# Patient Record
Sex: Female | Born: 1950 | Marital: Single
Health system: Southern US, Community
[De-identification: ages and names within clinical notes are randomized; demographics above are authoritative.]

---

## 2021-06-18 ENCOUNTER — Inpatient Hospital Stay
Admission: RE | Admit: 2021-06-18 | Discharge: 2021-07-29 | Disposition: E | Payer: Medicare PPO | Source: Ambulatory Visit | Attending: Internal Medicine | Admitting: Internal Medicine

## 2021-06-18 ENCOUNTER — Other Ambulatory Visit (HOSPITAL_COMMUNITY): Payer: Medicare PPO

## 2021-06-18 DIAGNOSIS — J449 Chronic obstructive pulmonary disease, unspecified: Secondary | ICD-10-CM | POA: Diagnosis not present

## 2021-06-18 DIAGNOSIS — M7989 Other specified soft tissue disorders: Secondary | ICD-10-CM

## 2021-06-18 DIAGNOSIS — J9621 Acute and chronic respiratory failure with hypoxia: Secondary | ICD-10-CM

## 2021-06-18 DIAGNOSIS — I5032 Chronic diastolic (congestive) heart failure: Secondary | ICD-10-CM

## 2021-06-18 DIAGNOSIS — J189 Pneumonia, unspecified organism: Secondary | ICD-10-CM | POA: Diagnosis not present

## 2021-06-18 DIAGNOSIS — I480 Paroxysmal atrial fibrillation: Secondary | ICD-10-CM

## 2021-06-18 LAB — COMPREHENSIVE METABOLIC PANEL
ALT: 17 U/L (ref 0–44)
AST: 16 U/L (ref 15–41)
Albumin: 1.7 g/dL — ABNORMAL LOW (ref 3.5–5.0)
Alkaline Phosphatase: 87 U/L (ref 38–126)
Anion gap: 8 (ref 5–15)
BUN: 34 mg/dL — ABNORMAL HIGH (ref 8–23)
CO2: 21 mmol/L — ABNORMAL LOW (ref 22–32)
Calcium: 9 mg/dL (ref 8.9–10.3)
Chloride: 105 mmol/L (ref 98–111)
Creatinine, Ser: 0.65 mg/dL (ref 0.44–1.00)
GFR, Estimated: >60 mL/min (ref >60)
Glucose, Bld: 128 mg/dL — ABNORMAL HIGH (ref 70–99)
Potassium: 3.6 mmol/L (ref 3.5–5.1)
Sodium: 134 mmol/L — ABNORMAL LOW (ref 135–145)
Total Bilirubin: 0.6 mg/dL (ref 0.3–1.2)
Total Protein: 5.5 g/dL — ABNORMAL LOW (ref 6.5–8.1)

## 2021-06-18 LAB — PROTIME-INR
INR: 1.4 — ABNORMAL HIGH (ref 0.8–1.2)
Prothrombin Time: 17.2 seconds — ABNORMAL HIGH (ref 11.4–15.2)

## 2021-06-18 LAB — CBC
HCT: 29 % — ABNORMAL LOW (ref 36.0–46.0)
Hemoglobin: 9.4 g/dL — ABNORMAL LOW (ref 12.0–15.0)
MCH: 31 pg (ref 26.0–34.0)
MCHC: 32.4 g/dL (ref 30.0–36.0)
MCV: 95.7 fL (ref 80.0–100.0)
Platelets: 430 10*3/uL — ABNORMAL HIGH (ref 150–400)
RBC: 3.03 MIL/uL — ABNORMAL LOW (ref 3.87–5.11)
RDW: 15.5 % (ref 11.5–15.5)
WBC: 29.9 10*3/uL — ABNORMAL HIGH (ref 4.0–10.5)
nRBC: 0 % (ref 0.0–0.2)

## 2021-06-18 LAB — BLOOD GAS, ARTERIAL
Acid-base deficit: 5.2 mmol/L — ABNORMAL HIGH (ref 0.0–2.0)
Acid-base deficit: 6.5 mmol/L — ABNORMAL HIGH (ref 0.0–2.0)
Bicarbonate: 23 mmol/L (ref 20.0–28.0)
Bicarbonate: 21.4 mmol/L (ref 20.0–28.0)
O2 Saturation: 98.2 %
O2 Saturation: 98.9 %
Patient temperature: 36.3
Patient temperature: 36.1
pCO2 arterial: 53 mmHg — ABNORMAL HIGH (ref 32–48)
pCO2 arterial: 49 mmHg — ABNORMAL HIGH (ref 32–48)
pH, Arterial: 7.24 — ABNORMAL LOW (ref 7.35–7.45)
pH, Arterial: 7.24 — ABNORMAL LOW (ref 7.35–7.45)
pO2, Arterial: 86 mmHg (ref 83–108)
pO2, Arterial: 86 mmHg (ref 83–108)

## 2021-06-18 LAB — LACTIC ACID, PLASMA: Lactic Acid, Venous: 0.7 mmol/L (ref 0.5–1.9)

## 2021-06-18 LAB — MAGNESIUM: Magnesium: 2.5 mg/dL — ABNORMAL HIGH (ref 1.7–2.4)

## 2021-06-18 MED ORDER — DIATRIZOATE MEGLUMINE & SODIUM 66-10 % PO SOLN
30.0000 mL | Freq: Once | ORAL | Status: AC
  Administered 2021-06-18: 30 mL

## 2021-06-18 NOTE — H&P (Addendum)
Pulmonary Critical Care Medicine ?[place] ?  ?PULMONARY CRITICAL CARE SERVICE ? ?PROGRESS NOTE ? ? ? ? ?Sherry Mcmahon  ?[id number]  ?DOB: March 14, 1950  ? ?DOA: 05/30/2021 ? ?Referring Physician: Satira Sark, MD ? ?HPI: Sherry Mcmahon is a 71 y.o. female being followed for ventilator/airway/oxygen weaning Acute on Chronic Respiratory Failure.  ? ?This is a 71 year old caucasian female with past medical history of COPD, etoh abuse, medication non-compliance, paroxysmal afib, pacemaker, chronic diastolic heart failure, 40 year history of smoking, hypertension, meningitis, osteopenia, spinal stenosis, depression, intracranial injury with surgical intervention. This patient presented initially presented to acute hospital with metabolic encephalology and increased shortness of breath. She was admitted for COPD exacerbation and acute on chronic respiratory failure with hypoxia and hypercapnia. Her hospital course was complicated bu septic shock, AKI, cardiogenic shock, metabolic acidosis, thrombocytopenia, and aspiration pneumonia. She subsequently was intubated for further respiratory distress on March 22nd. Blood cultures, urine culture and sputum were positive for Ecoli. Despite being on Vanco, meropenem, her leukocytosis remained elevated, and was placed on Micafungin. Her AKI further got worse requiring HD to be initiated 03/25. She underwent trach placement 04/10, with PEG placement on 4/11. She was transferred to [facility] for further vent weaning. On arrival her ABG remained consistent with metabiotic acidosis and her white count was significantly elevated. At this time she will remain on full support without weaning, underlying acidosis needs to be managed. I have placed an order for lactic acid and CT of the chest for further evaluation.  ? ?Medications: ?Reviewed on Rounds ? ?Physical Exam: ? ?Vitals: temp 97.4, pulse 80, respirations 26, BP 123/68, Spo2 95% ? ?Ventilator Settings AC VC 35%,  rate 16, TV 400 peep 5 ? ?General: Comfortable at this time ?Neck: supple ?Cardiovascular: no malignant arrhythmias ?Respiratory: Bilaterally diminished  ?Skin: no rash seen on limited exam ?Musculoskeletal: No gross abnormality ?Psychiatric:unable to assess ?Neurologic:no involuntary movements   ?   ?   ?Lab Data:  ? ?Basic Metabolic Panel: ?Recent Labs  ?Lab 06/25/2021 ?0446  ?NA 134*  ?K 3.6  ?CL 105  ?CO2 21*  ?GLUCOSE 128*  ?BUN 34*  ?CREATININE 0.65  ?CALCIUM 9.0  ?MG 2.5*  ? ? ?ABG: ?Recent Labs  ?Lab 05/30/2021 ?0205 06/25/2021 ?0600  ?PHART 7.24* 7.24*  ?PCO2ART 53* 49*  ?PO2ART 86 86  ?HCO3 23.0 21.4  ?O2SAT 98.2 98.9  ? ? ?Liver Function Tests: ?Recent Labs  ?Lab 06/17/2021 ?0446  ?AST 16  ?ALT 17  ?ALKPHOS 87  ?BILITOT 0.6  ?PROT 5.5*  ?ALBUMIN 1.7*  ? ?No results for input(s): LIPASE, AMYLASE in the last 168 hours. ?No results for input(s): AMMONIA in the last 168 hours. ? ?CBC: ?Recent Labs  ?Lab 06/11/2021 ?0446  ?WBC 29.9*  ?HGB 9.4*  ?HCT 29.0*  ?MCV 95.7  ?PLT 430*  ? ? ?Cardiac Enzymes: ?No results for input(s): CKTOTAL, CKMB, CKMBINDEX, TROPONINI in the last 168 hours. ? ?BNP (last 3 results) ?No results for input(s): BNP in the last 8760 hours. ? ?ProBNP (last 3 results) ?No results for input(s): PROBNP in the last 8760 hours. ? ?Radiological Exams: ?DG ABDOMEN PEG TUBE LOCATION ? ?Result Date: 06/05/2021 ?CLINICAL DATA:  71 year old female status post PEG tube placement. EXAM: ABDOMEN - 1 VIEW COMPARISON:  No priors. FINDINGS: Percutaneous gastrostomy tube noted with retention balloon projecting over the stomach. There is a small amount of oral contrast material within the lumen of the proximal stomach. Remaining portions of the visualized  bowel gas pattern are unremarkable, without definite pathologic dilatation of visualized small bowel or colon. No definite pneumoperitoneum on this single supine view. IMPRESSION: 1. Tip of percutaneous gastrostomy tube retention balloon projects over the stomach, with  contrast material noted within the lumen of the stomach. Electronically Signed   By: Vinnie Langton M.D.   On: 06/24/2021 05:09  ? ?DG CHEST PORT 1 VIEW ? ?Result Date: 06/09/2021 ?CLINICAL DATA:  Respiratory failure EXAM: PORTABLE CHEST 1 VIEW COMPARISON:  None. FINDINGS: Tracheostomy tube in place. Partially covered percutaneous gastrostomy tube. Dual-chamber pacer leads from the left.  Cardiomegaly. Extensive bilateral pulmonary opacity with patchy appearance. Volume loss and probable pleural fluid on the right. No visible pneumothorax. Enteric contrast at the level of the proximal stomach. IMPRESSION: Bilateral airspace disease with heterogeneous appearance favoring pneumonia. Probable pleural fluid on the right where there is also volume loss. Electronically Signed   By: Jorje Guild M.D.   On: 06/04/2021 04:48   ? ?Assessment/Plan ?Active Problems: ?  Acute on chronic respiratory failure with hypoxia (HCC) ?  CAP (community acquired pneumonia) ?  COPD mixed type ([place]) ?  Chronic diastolic CHF (congestive heart failure) ([place]) ?  Paroxysmal atrial fibrillation (HCC) ? ?Acute on chronic respiratory failure with hypoxia and hypercapnia- remains on full support today. Continue with pulmonary toilet. ?Persistent community acquired pneumonia-patient has completed outside course of broad-spectrum antibiotics, remains with elevated leukocytosis despite management.  Was started on micafungin.  Awaiting CT imaging for clear picture.  Continue monitoring CBC and vital signs. ?COPD-Continue with supportive care and make medical adjustments as necessary. ?Chronic diastolic heart failure- continue to follow fluid status, continue telemetry. ? ? ?I have personally seen and evaluated the patient, evaluated laboratory and imaging results, formulated the assessment and plan and placed orders. ?The Patient requires high complexity decision making with multiple systems involvement.  ?Rounds were done with the Respiratory  Therapy Director and Staff therapists and discussed with nursing staff also. ? ?Allyne Gee, MD FCCP ?Pulmonary Critical Care Medicine ?Sleep Medicine  ?

## 2021-06-19 DIAGNOSIS — J189 Pneumonia, unspecified organism: Secondary | ICD-10-CM | POA: Diagnosis not present

## 2021-06-19 DIAGNOSIS — J449 Chronic obstructive pulmonary disease, unspecified: Secondary | ICD-10-CM | POA: Diagnosis not present

## 2021-06-19 DIAGNOSIS — I5032 Chronic diastolic (congestive) heart failure: Secondary | ICD-10-CM | POA: Diagnosis not present

## 2021-06-19 DIAGNOSIS — J9621 Acute and chronic respiratory failure with hypoxia: Secondary | ICD-10-CM | POA: Diagnosis not present

## 2021-06-19 LAB — BLOOD GAS, ARTERIAL
Acid-base deficit: 3.3 mmol/L — ABNORMAL HIGH (ref 0.0–2.0)
Bicarbonate: 23.2 mmol/L (ref 20.0–28.0)
Drawn by: 164
O2 Saturation: 99 %
Patient temperature: 37
pCO2 arterial: 46 mmHg (ref 32–48)
pH, Arterial: 7.31 — ABNORMAL LOW (ref 7.35–7.45)
pO2, Arterial: 85 mmHg (ref 83–108)

## 2021-06-19 NOTE — Progress Notes (Addendum)
Pulmonary Critical Care Medicine ?[facility] [place] ?  ?PULMONARY CRITICAL CARE SERVICE ? ?PROGRESS NOTE ? ? ? ? ?Sherry Mcmahon  ?[id number]  ?DOB: [birth year]  ? ?DOA: 06/10/2021 ? ?Referring Physician: Luna Kitchens, MD ? ?HPI: Sherry Mcmahon is a 71 y.o. female being followed for ventilator/airway/oxygen weaning Acute on Chronic Respiratory Failure.  Patient seen lying in bed, currently remains on full support.  She refused to have her CT completed this morning.  I spoke to internal medicine about approaching it with some as needed medication and attempting a second time.  No acute distress, no acute overnight events. ? ?Medications: ?Reviewed on Rounds ? ?Physical Exam: ? ?Vitals: Temp 96.3, pulse 80, respirations 27, BP 125/70, SPO2 97% ? ?Ventilator Settings AC VC, tidal volume 480, FiO2 28%, rate 16, PEEP 5 ? ?General: Comfortable at this time ?Neck: supple ?Cardiovascular: no malignant arrhythmias ?Respiratory: Bilaterally clear ?Skin: no rash seen on limited exam ?Musculoskeletal: No gross abnormality ?Psychiatric:unable to assess ?Neurologic:no involuntary movements   ?   ?   ?Lab Data:  ? ?Basic Metabolic Panel: ?Recent Labs  ?Lab 06/09/2021 ?0446  ?NA 134*  ?K 3.6  ?CL 105  ?CO2 21*  ?GLUCOSE 128*  ?BUN 34*  ?CREATININE 0.65  ?CALCIUM 9.0  ?MG 2.5*  ? ? ?ABG: ?Recent Labs  ?Lab 06/26/2021 ?0205 06/22/2021 ?0600  ?PHART 7.24* 7.24*  ?PCO2ART 53* 49*  ?PO2ART 86 86  ?HCO3 23.0 21.4  ?O2SAT 98.2 98.9  ? ? ?Liver Function Tests: ?Recent Labs  ?Lab 06/08/2021 ?0446  ?AST 16  ?ALT 17  ?ALKPHOS 87  ?BILITOT 0.6  ?PROT 5.5*  ?ALBUMIN 1.7*  ? ?No results for input(s): LIPASE, AMYLASE in the last 168 hours. ?No results for input(s): AMMONIA in the last 168 hours. ? ?CBC: ?Recent Labs  ?Lab 06/09/2021 ?0446  ?WBC 29.9*  ?HGB 9.4*  ?HCT 29.0*  ?MCV 95.7  ?PLT 430*  ? ? ?Cardiac Enzymes: ?No results for input(s): CKTOTAL, CKMB, CKMBINDEX, TROPONINI in the last 168 hours. ? ?BNP (last 3 results) ?No results for  input(s): BNP in the last 8760 hours. ? ?ProBNP (last 3 results) ?No results for input(s): PROBNP in the last 8760 hours. ? ?Radiological Exams: ?DG ABDOMEN PEG TUBE LOCATION ? ?Result Date: 06/15/2021 ?CLINICAL DATA:  71 year old female status post PEG tube placement. EXAM: ABDOMEN - 1 VIEW COMPARISON:  No priors. FINDINGS: Percutaneous gastrostomy tube noted with retention balloon projecting over the stomach. There is a small amount of oral contrast material within the lumen of the proximal stomach. Remaining portions of the visualized bowel gas pattern are unremarkable, without definite pathologic dilatation of visualized small bowel or colon. No definite pneumoperitoneum on this single supine view. IMPRESSION: 1. Tip of percutaneous gastrostomy tube retention balloon projects over the stomach, with contrast material noted within the lumen of the stomach. Electronically Signed   By: Trudie Reed M.D.   On: 05/31/2021 05:09  ? ?DG CHEST PORT 1 VIEW ? ?Result Date: 06/14/2021 ?CLINICAL DATA:  Respiratory failure EXAM: PORTABLE CHEST 1 VIEW COMPARISON:  None. FINDINGS: Tracheostomy tube in place. Partially covered percutaneous gastrostomy tube. Dual-chamber pacer leads from the left.  Cardiomegaly. Extensive bilateral pulmonary opacity with patchy appearance. Volume loss and probable pleural fluid on the right. No visible pneumothorax. Enteric contrast at the level of the proximal stomach. IMPRESSION: Bilateral airspace disease with heterogeneous appearance favoring pneumonia. Probable pleural fluid on the right where there is also volume loss. Electronically Signed   By: Christiane Ha  Watts M.D.   On: 07/11/21 04:48   ? ?Assessment/Plan ?Active Problems: ?  * No active hospital problems. * ? ?Acute on chronic respiratory failure with hypoxia and hypercapnia- remain son full support today. Continue with pulmonary toilet. ?Persistent community acquired pneumonia-patient has completed outside course of broad-spectrum  antibiotics, remains with elevated leukocytosis despite management.  Was started on micafungin.  Awaiting CT imaging for clear picture.  Continue monitoring CBC and vital signs. ?COPD-Continue with supportive care and make medical adjustments as necessary. ?Chronic diastolic heart failure- continue to follow fluid status, continue telemetry. ? ? ?I have personally seen and evaluated the patient, evaluated laboratory and imaging results, formulated the assessment and plan and placed orders. ?The Patient requires high complexity decision making with multiple systems involvement.  ?Rounds were done with the Respiratory Therapy Director and Staff therapists and discussed with nursing staff also. ? ?Yevonne Pax, MD FCCP ?Pulmonary Critical Care Medicine ?Sleep Medicine  ?

## 2021-06-20 ENCOUNTER — Other Ambulatory Visit (HOSPITAL_COMMUNITY): Payer: Medicare PPO

## 2021-06-20 DIAGNOSIS — I5032 Chronic diastolic (congestive) heart failure: Secondary | ICD-10-CM | POA: Diagnosis not present

## 2021-06-20 DIAGNOSIS — J449 Chronic obstructive pulmonary disease, unspecified: Secondary | ICD-10-CM | POA: Diagnosis not present

## 2021-06-20 DIAGNOSIS — J189 Pneumonia, unspecified organism: Secondary | ICD-10-CM | POA: Diagnosis not present

## 2021-06-20 DIAGNOSIS — J9621 Acute and chronic respiratory failure with hypoxia: Secondary | ICD-10-CM | POA: Diagnosis not present

## 2021-06-20 NOTE — Progress Notes (Addendum)
Pulmonary Critical Care Medicine ?[facility] [place] ?  ?PULMONARY CRITICAL CARE SERVICE ? ?PROGRESS NOTE ? ? ? ? ?[name]  ?[id number]:[id number]  ?DOB: [birth year]  ? ?DOA: 06/10/2021 ? ?Referring Physician: Luna Kitchens, MD ? ?HPI: Sherry Mcmahon is a 71 y.o. female being followed for ventilator/airway/oxygen weaning Acute on Chronic Respiratory Failure.  Patient seen lying in bed.  Remains on full support.  pH was not within parameters to start weaning yet, likely weaning will start this week.  No acute distress, no acute overnight events. ? ?Medications: ?Reviewed on Rounds ? ?Physical Exam: ? ?Vitals: Temp 97.4, pulse 80, respirations 27, BP 126/77, SPO2 96% ? ?Ventilator Settings AC VC, tidal volume 480, FiO2 28%, rate 16, PEEP 5 ? ?General: Comfortable at this time ?Neck: supple ?Cardiovascular: no malignant arrhythmias ?Respiratory: Bilaterally clear ?Skin: no rash seen on limited exam ?Musculoskeletal: No gross abnormality ?Psychiatric:unable to assess ?Neurologic:no involuntary movements   ?   ?   ?Lab Data:  ? ?Basic Metabolic Panel: ?Recent Labs  ?Lab 06/27/2021 ?0446  ?NA 134*  ?K 3.6  ?CL 105  ?CO2 21*  ?GLUCOSE 128*  ?BUN 34*  ?CREATININE 0.65  ?CALCIUM 9.0  ?MG 2.5*  ? ? ?ABG: ?Recent Labs  ?Lab 05/31/2021 ?0205 06/08/2021 ?0600 06/19/21 ?1405  ?PHART 7.24* 7.24* 7.31*  ?PCO2ART 53* 49* 46  ?PO2ART 86 86 85  ?HCO3 23.0 21.4 23.2  ?O2SAT 98.2 98.9 99  ? ? ?Liver Function Tests: ?Recent Labs  ?Lab 06/25/2021 ?0446  ?AST 16  ?ALT 17  ?ALKPHOS 87  ?BILITOT 0.6  ?PROT 5.5*  ?ALBUMIN 1.7*  ? ?No results for input(s): LIPASE, AMYLASE in the last 168 hours. ?No results for input(s): AMMONIA in the last 168 hours. ? ?CBC: ?Recent Labs  ?Lab 05/31/2021 ?0446  ?WBC 29.9*  ?HGB 9.4*  ?HCT 29.0*  ?MCV 95.7  ?PLT 430*  ? ? ?Cardiac Enzymes: ?No results for input(s): CKTOTAL, CKMB, CKMBINDEX, TROPONINI in the last 168 hours. ? ?BNP (last 3 results) ?No results for input(s): BNP in the last 8760 hours. ? ?ProBNP (last 3  results) ?No results for input(s): PROBNP in the last 8760 hours. ? ?Radiological Exams: ?No results found. ? ?Assessment/Plan ?Active Problems: ?  * No active hospital problems. * ? ?Acute on chronic respiratory failure with hypoxia and hypercapnia- remains on full support today.  pH not within parameters to start weaning just yet.  Likely weaning trials will begin this week.  Continue with pulmonary toilet. ?Persistent community acquired pneumonia-patient has completed outside course of broad-spectrum antibiotics, remains with elevated leukocytosis despite management.  Was started on micafungin.  Awaiting CT imaging for clear picture.  Continue monitoring CBC and vital signs. ?COPD-Continue with supportive care and make medical adjustments as necessary. ?Chronic diastolic heart failure- continue to follow fluid status, continue telemetry. ? ? ?I have personally seen and evaluated the patient, evaluated laboratory and imaging results, formulated the assessment and plan and placed orders. ?The Patient requires high complexity decision making with multiple systems involvement.  ?Rounds were done with the Respiratory Therapy Director and Staff therapists and discussed with nursing staff also. ? ?Yevonne Pax, MD FCCP ?Pulmonary Critical Care Medicine ?Sleep Medicine  ?

## 2021-06-21 DIAGNOSIS — J9621 Acute and chronic respiratory failure with hypoxia: Secondary | ICD-10-CM | POA: Diagnosis not present

## 2021-06-21 DIAGNOSIS — J189 Pneumonia, unspecified organism: Secondary | ICD-10-CM

## 2021-06-21 DIAGNOSIS — J449 Chronic obstructive pulmonary disease, unspecified: Secondary | ICD-10-CM

## 2021-06-21 DIAGNOSIS — I480 Paroxysmal atrial fibrillation: Secondary | ICD-10-CM

## 2021-06-21 DIAGNOSIS — I5032 Chronic diastolic (congestive) heart failure: Secondary | ICD-10-CM | POA: Diagnosis not present

## 2021-06-21 LAB — CBC
HCT: 30.2 % — ABNORMAL LOW (ref 36.0–46.0)
Hemoglobin: 9.2 g/dL — ABNORMAL LOW (ref 12.0–15.0)
MCH: 29.8 pg (ref 26.0–34.0)
MCHC: 30.5 g/dL (ref 30.0–36.0)
MCV: 97.7 fL (ref 80.0–100.0)
Platelets: 381 10*3/uL (ref 150–400)
RBC: 3.09 MIL/uL — ABNORMAL LOW (ref 3.87–5.11)
RDW: 15.8 % — ABNORMAL HIGH (ref 11.5–15.5)
WBC: 18.3 10*3/uL — ABNORMAL HIGH (ref 4.0–10.5)
nRBC: 0.1 % (ref 0.0–0.2)

## 2021-06-21 LAB — COMPREHENSIVE METABOLIC PANEL
ALT: 16 U/L (ref 0–44)
AST: 15 U/L (ref 15–41)
Albumin: 1.5 g/dL — ABNORMAL LOW (ref 3.5–5.0)
Alkaline Phosphatase: 78 U/L (ref 38–126)
Anion gap: 8 (ref 5–15)
BUN: 49 mg/dL — ABNORMAL HIGH (ref 8–23)
CO2: 21 mmol/L — ABNORMAL LOW (ref 22–32)
Calcium: 9.4 mg/dL (ref 8.9–10.3)
Chloride: 109 mmol/L (ref 98–111)
Creatinine, Ser: 0.61 mg/dL (ref 0.44–1.00)
GFR, Estimated: >60 mL/min (ref >60)
Glucose, Bld: 114 mg/dL — ABNORMAL HIGH (ref 70–99)
Potassium: 4.3 mmol/L (ref 3.5–5.1)
Sodium: 138 mmol/L (ref 135–145)
Total Bilirubin: 0.3 mg/dL (ref 0.3–1.2)
Total Protein: 5.6 g/dL — ABNORMAL LOW (ref 6.5–8.1)

## 2021-06-22 ENCOUNTER — Other Ambulatory Visit (HOSPITAL_COMMUNITY): Payer: Medicare PPO

## 2021-06-22 DIAGNOSIS — J189 Pneumonia, unspecified organism: Secondary | ICD-10-CM | POA: Diagnosis not present

## 2021-06-22 DIAGNOSIS — I5032 Chronic diastolic (congestive) heart failure: Secondary | ICD-10-CM | POA: Diagnosis not present

## 2021-06-22 DIAGNOSIS — J9621 Acute and chronic respiratory failure with hypoxia: Secondary | ICD-10-CM | POA: Diagnosis not present

## 2021-06-22 DIAGNOSIS — J449 Chronic obstructive pulmonary disease, unspecified: Secondary | ICD-10-CM | POA: Diagnosis not present

## 2021-06-22 HISTORY — PX: IR THORACENTESIS ASP PLEURAL SPACE W/IMG GUIDE: IMG5380

## 2021-06-22 MED ORDER — LIDOCAINE HCL 1 % IJ SOLN
INTRAMUSCULAR | Status: AC
Start: 2021-06-22 — End: 2021-06-22
  Administered 2021-06-22: 10 mL
  Filled 2021-06-22: qty 20

## 2021-06-22 MED ORDER — LIDOCAINE HCL 1 % IJ SOLN
INTRAMUSCULAR | Status: AC
  Filled 2021-06-22: qty 20

## 2021-06-22 MED ORDER — LIDOCAINE HCL 1 % IJ SOLN
INTRAMUSCULAR | Status: DC | PRN
  Administered 2021-06-22: 5 mL

## 2021-06-22 NOTE — Procedures (Signed)
Vascular and Interventional Radiology Procedure Note ? ?Patient: Sherry Mcmahon ?DOB: Nov 21, 1950 ?Medical Record Number: [id number] ?Note Date/Time: 06/22/21 5:22 PM  ? ?Performing Physician: Roanna Banning, MD ?Assistant(s): None ? ?Diagnosis: R PTX s/p aborted thoracentesis ? ?Procedure: RIGHT PLEURAL DRAINAGE CATHETER PLACEMENT ? ?Anesthesia: Local Anesthetic ?Complications: None ?Estimated Blood Loss: Minimal ?Specimens: Sent for None ? ?Findings:  ?Successful Fluoroscopy-guided placement of 10 F pleural thoracostomy catheter into the RIGHT chest. ? ?Plan:  ?- Maintain to -20 mmHg suction. ?- CXR in AM ?- VIR will continue to follow ? ?See detailed procedure note with images in PACS. ?The patient tolerated the procedure well without incident or complication and was returned to Floor Bed in stable condition.  ? ? ?Roanna Banning, MD ?[facility] ?[facility] ? ? ?Pager. [phone] ?Clinic. [phone]  ?

## 2021-06-22 NOTE — Procedures (Addendum)
Unsuccessful ultrasound-guided therapeutic paracentesis. Unable to access fluid due to patient positioning and patient's inability to remain in an accessible position.  ?

## 2021-06-22 NOTE — Progress Notes (Addendum)
Pulmonary Critical Care Medicine ?[place] ?  ?PULMONARY CRITICAL CARE SERVICE ? ?PROGRESS NOTE ? ? ? ? ?Sherry Mcmahon  ?[id number]  ?DOB: [birth year]  ? ?DOA: 06/05/2021 ? ?Referring Physician: Satira Sark, MD ? ?HPI: Sherry Mcmahon is a 71 y.o. female being followed for ventilator/airway/oxygen weaning Acute on Chronic Respiratory Failure.  Patient seen lying in bed, currently remains on full support.  We are waiting her thoracentesis to be completed before moving onto weaning.  No acute distress, no acute overnight events. ?Medications: ?Reviewed on Rounds ? ?Physical Exam: ? ?Vitals: Temp T7.8, pulse 80, respirations 26, BP 93/48, SPO2 97% ? ?Ventilator Settings AC VC, tidal volume 480, FiO2 28%, rate 16, PEEP 5 ? ?General: Comfortable at this time ?Neck: supple ?Cardiovascular: no malignant arrhythmias ?Respiratory: Bilaterally clear ?Skin: no rash seen on limited exam ?Musculoskeletal: No gross abnormality ?Psychiatric:unable to assess ?Neurologic:no involuntary movements   ?   ?   ?Lab Data:  ? ?Basic Metabolic Panel: ?Recent Labs  ?Lab 06/13/2021 ?0446 06/21/21 ?0912  ?NA 134* 138  ?K 3.6 4.3  ?CL 105 109  ?CO2 21* 21*  ?GLUCOSE 128* 114*  ?BUN 34* 49*  ?CREATININE 0.65 0.61  ?CALCIUM 9.0 9.4  ?MG 2.5*  --   ? ? ?ABG: ?Recent Labs  ?Lab 06/17/2021 ?0205 06/01/2021 ?0600 06/19/21 ?1405  ?PHART 7.24* 7.24* 7.31*  ?PCO2ART 53* 49* 46  ?PO2ART 86 86 85  ?HCO3 23.0 21.4 23.2  ?O2SAT 98.2 98.9 99  ? ? ?Liver Function Tests: ?Recent Labs  ?Lab 06/17/2021 ?0446 06/21/21 ?0912  ?AST 16 15  ?ALT 17 16  ?ALKPHOS 87 78  ?BILITOT 0.6 0.3  ?PROT 5.5* 5.6*  ?ALBUMIN 1.7* 1.5*  ? ?No results for input(s): LIPASE, AMYLASE in the last 168 hours. ?No results for input(s): AMMONIA in the last 168 hours. ? ?CBC: ?Recent Labs  ?Lab 05/31/2021 ?0446 06/21/21 ?0912  ?WBC 29.9* 18.3*  ?HGB 9.4* 9.2*  ?HCT 29.0* 30.2*  ?MCV 95.7 97.7  ?PLT 430* 381  ? ? ?Cardiac Enzymes: ?No results for input(s): CKTOTAL, CKMB, CKMBINDEX,  TROPONINI in the last 168 hours. ? ?BNP (last 3 results) ?No results for input(s): BNP in the last 8760 hours. ? ?ProBNP (last 3 results) ?No results for input(s): PROBNP in the last 8760 hours. ? ?Radiological Exams: ?CT CHEST WO CONTRAST ? ?Result Date: 06/20/2021 ?CLINICAL DATA:  Pneumonia EXAM: CT CHEST WITHOUT CONTRAST TECHNIQUE: Multidetector CT imaging of the chest was performed following the standard protocol without IV contrast. RADIATION DOSE REDUCTION: This exam was performed according to the departmental dose-optimization program which includes automated exposure control, adjustment of the mA and/or kV according to patient size and/or use of iterative reconstruction technique. COMPARISON:  None. FINDINGS: Cardiovascular: Aortic atherosclerosis. Normal heart size. Left and right coronary artery calcifications. Gross enlargement of the main pulmonary artery measuring up to 4.2 cm in caliber. No pericardial effusion. Mediastinum/Nodes: Numerous prominent mediastinal lymph nodes. Tracheostomy. Thyroid gland, and esophagus demonstrate no significant findings. Lungs/Pleura: Moderate to large right, small left pleural effusions and associated atelectasis or consolidation. Multiple cavitary lesions within the right lung, most conspicuously a thick-walled lesion in the anterior right upper lobe measuring 3.7 x 2.5 cm (series 5, image 60). Extensive heterogeneous and ground-glass airspace opacity throughout the lungs, more conspicuous on the left. Upper Abdomen: No acute abnormality. Musculoskeletal: No chest wall abnormality. No suspicious osseous lesions identified. IMPRESSION: 1. Moderate to large right, small left pleural effusions and associated atelectasis or consolidation. 2.  Multiple cavitary lesions within the right lung, as well as extensive heterogeneous and ground-glass airspace opacity throughout the lungs bilaterally. Findings are most consistent with multifocal infection, including necrotizing  components. 3. Numerous prominent mediastinal lymph nodes, likely reactive. 4. Tracheostomy. 5. Gross enlargement of the main pulmonary artery, as can seen in pulmonary hypertension. 6. Coronary artery disease. Aortic Atherosclerosis (ICD10-I70.0). Electronically Signed   By: Delanna Ahmadi M.D.   On: 06/20/2021 12:59   ? ?Assessment/Plan ?Active Problems: ?  Acute on chronic respiratory failure with hypoxia (HCC) ?  CAP (community acquired pneumonia) ?  COPD mixed type ([place]) ?  Chronic diastolic CHF (congestive heart failure) ([place]) ?  Paroxysmal atrial fibrillation (HCC) ? ?Acute on chronic respiratory failure with hypoxia and hypercapnia- remains on full support today.  Her repeat CT was with bilateral pleural effusions, she will need a thoracentesis.  We will hold her weaning today. continue with pulmonary toilet. ?Persistent community acquired pneumonia-patient has completed outside course of broad-spectrum antibiotics, remains with elevated leukocytosis despite management.  Was started on micafungin.  CT with bilateral pleural effusions, right cavitating lesions and groundglass opacities.  Order for right thoracentesis was placed yesterday, we are still awaiting procedure.  Continue monitoring CBC and vital signs. ?Bilateral pleural effusions-order for right thoracentesis ending he with multiple cavity lesions within the right lung and groundglass opacities throughout the lungs. ?COPD-Continue with supportive care and make medical adjustments as necessary. ?Chronic diastolic heart failure- continue to follow fluid status, continue telemetry. ? ? ?I have personally seen and evaluated the patient, evaluated laboratory and imaging results, formulated the assessment and plan and placed orders. ?The Patient requires high complexity decision making with multiple systems involvement.  ?Rounds were done with the Respiratory Therapy Director and Staff therapists and discussed with nursing staff also. ? ?Allyne Gee, MD  FCCP ?Pulmonary Critical Care Medicine ?Sleep Medicine  ?

## 2021-06-22 NOTE — Progress Notes (Addendum)
Pulmonary Critical Care Medicine ?[facility] [place] ?  ?PULMONARY CRITICAL CARE SERVICE ? ?PROGRESS NOTE ? ? ? ? ?Sherry Mcmahon  ?NWG:[id number]  ?DOB: 12-07-1950  ? ?DOA: 06/22/2021 ? ?Referring Physician: Luna Kitchens, MD ? ?HPI: Sherry Mcmahon is a 71 y.o. female being followed for ventilator/airway/oxygen weaning Acute on Chronic Respiratory Failure.  Patient seen lying in bed.  She continues to fail her spontaneous breathing trials.  Her repeat CT showed lateral pleural effusions and containing lesions in the right lung and ground glass opacities.  She will need a right thoracentesis.  We will hold her wean today.  No acute distress, no acute overnight events. ?She continues ?Medications: ?Reviewed on Rounds ? ?Physical Exam: ? ?Vitals: Temp 97.3, pulse 80, respirations 23, BP 103 64, SPO2 96% ? ?Ventilator Settings AC VC, tidal volume 480, FiO2 28%, rate 16, PEEP 5 ? ?General: Comfortable at this time ?Neck: supple ?Cardiovascular: no malignant arrhythmias ?Respiratory: Bilaterally clear post suction ?Skin: no rash seen on limited exam ?Musculoskeletal: No gross abnormality ?Psychiatric:unable to assess ?Neurologic:no involuntary movements   ?   ?   ?Lab Data:  ? ?Basic Metabolic Panel: ?Recent Labs  ?Lab 06/12/2021 ?0446 06/21/21 ?0912  ?NA 134* 138  ?K 3.6 4.3  ?CL 105 109  ?CO2 21* 21*  ?GLUCOSE 128* 114*  ?BUN 34* 49*  ?CREATININE 0.65 0.61  ?CALCIUM 9.0 9.4  ?MG 2.5*  --   ? ? ?ABG: ?Recent Labs  ?Lab 06/01/2021 ?0205 06/22/2021 ?0600 06/19/21 ?1405  ?PHART 7.24* 7.24* 7.31*  ?PCO2ART 53* 49* 46  ?PO2ART 86 86 85  ?HCO3 23.0 21.4 23.2  ?O2SAT 98.2 98.9 99  ? ? ?Liver Function Tests: ?Recent Labs  ?Lab 06/13/2021 ?0446 06/21/21 ?0912  ?AST 16 15  ?ALT 17 16  ?ALKPHOS 87 78  ?BILITOT 0.6 0.3  ?PROT 5.5* 5.6*  ?ALBUMIN 1.7* 1.5*  ? ?No results for input(s): LIPASE, AMYLASE in the last 168 hours. ?No results for input(s): AMMONIA in the last 168 hours. ? ?CBC: ?Recent Labs  ?Lab 06/21/2021 ?0446 06/21/21 ?0912   ?WBC 29.9* 18.3*  ?HGB 9.4* 9.2*  ?HCT 29.0* 30.2*  ?MCV 95.7 97.7  ?PLT 430* 381  ? ? ?Cardiac Enzymes: ?No results for input(s): CKTOTAL, CKMB, CKMBINDEX, TROPONINI in the last 168 hours. ? ?BNP (last 3 results) ?No results for input(s): BNP in the last 8760 hours. ? ?ProBNP (last 3 results) ?No results for input(s): PROBNP in the last 8760 hours. ? ?Radiological Exams: ?CT CHEST WO CONTRAST ? ?Result Date: 06/20/2021 ?CLINICAL DATA:  Pneumonia EXAM: CT CHEST WITHOUT CONTRAST TECHNIQUE: Multidetector CT imaging of the chest was performed following the standard protocol without IV contrast. RADIATION DOSE REDUCTION: This exam was performed according to the departmental dose-optimization program which includes automated exposure control, adjustment of the mA and/or kV according to patient size and/or use of iterative reconstruction technique. COMPARISON:  None. FINDINGS: Cardiovascular: Aortic atherosclerosis. Normal heart size. Left and right coronary artery calcifications. Gross enlargement of the main pulmonary artery measuring up to 4.2 cm in caliber. No pericardial effusion. Mediastinum/Nodes: Numerous prominent mediastinal lymph nodes. Tracheostomy. Thyroid gland, and esophagus demonstrate no significant findings. Lungs/Pleura: Moderate to large right, small left pleural effusions and associated atelectasis or consolidation. Multiple cavitary lesions within the right lung, most conspicuously a thick-walled lesion in the anterior right upper lobe measuring 3.7 x 2.5 cm (series 5, image 60). Extensive heterogeneous and ground-glass airspace opacity throughout the lungs, more conspicuous on the left. Upper Abdomen:  No acute abnormality. Musculoskeletal: No chest wall abnormality. No suspicious osseous lesions identified. IMPRESSION: 1. Moderate to large right, small left pleural effusions and associated atelectasis or consolidation. 2. Multiple cavitary lesions within the right lung, as well as extensive  heterogeneous and ground-glass airspace opacity throughout the lungs bilaterally. Findings are most consistent with multifocal infection, including necrotizing components. 3. Numerous prominent mediastinal lymph nodes, likely reactive. 4. Tracheostomy. 5. Gross enlargement of the main pulmonary artery, as can seen in pulmonary hypertension. 6. Coronary artery disease. Aortic Atherosclerosis (ICD10-I70.0). Electronically Signed   By: Jearld Lesch M.D.   On: 06/20/2021 12:59   ? ?Assessment/Plan ?Active Problems: ?  Acute on chronic respiratory failure with hypoxia (HCC) ?  CAP (community acquired pneumonia) ?  COPD mixed type (HCC) ?  Chronic diastolic CHF (congestive heart failure) (HCC) ?  Paroxysmal atrial fibrillation (HCC) ? ?Acute on chronic respiratory failure with hypoxia and hypercapnia- remains on full support today.  Her repeat CT was with bilateral pleural effusions, she will need a thoracentesis.  We will hold her weaning today.  Continue with pulmonary toilet. ?Persistent community acquired pneumonia-patient has completed outside course of broad-spectrum antibiotics, remains with elevated leukocytosis despite management.  Was started on micafungin.  CT with bilateral pleural effusions, right cavitating lesions and groundglass opacities.  Order for right thoracentesis was placed today.  Continue monitoring CBC and vital signs. ?Bilateral pleural effusions-order for right thoracentesis placed.  He with multiple cavity lesions within the right lung and groundglass opacities throughout the lungs. ?COPD-Continue with supportive care and make medical adjustments as necessary. ?Chronic diastolic heart failure- continue to follow fluid status, continue telemetry. ? ? ?I have personally seen and evaluated the patient, evaluated laboratory and imaging results, formulated the assessment and plan and placed orders. ?The Patient requires high complexity decision making with multiple systems involvement.  ?Rounds  were done with the Respiratory Therapy Director and Staff therapists and discussed with nursing staff also. ? ?Yevonne Pax, MD FCCP ?Pulmonary Critical Care Medicine ?Sleep Medicine  ?

## 2021-06-23 ENCOUNTER — Other Ambulatory Visit (HOSPITAL_COMMUNITY): Payer: Medicare PPO

## 2021-06-23 DIAGNOSIS — I5032 Chronic diastolic (congestive) heart failure: Secondary | ICD-10-CM | POA: Diagnosis not present

## 2021-06-23 DIAGNOSIS — J449 Chronic obstructive pulmonary disease, unspecified: Secondary | ICD-10-CM | POA: Diagnosis not present

## 2021-06-23 DIAGNOSIS — J9621 Acute and chronic respiratory failure with hypoxia: Secondary | ICD-10-CM | POA: Diagnosis not present

## 2021-06-23 DIAGNOSIS — J189 Pneumonia, unspecified organism: Secondary | ICD-10-CM | POA: Diagnosis not present

## 2021-06-23 NOTE — Progress Notes (Signed)
? ? ?Referring Physician(s): ? ? ?Supervising Physician: Marliss Coots ? ?Patient Status:  [facility] - In-pt ? ?Chief Complaint: ?Pneumothorax s/p thoracentesis ? ?Subjective: ?Patient alert, arousable on vent.  ?Makes attempts to communicate.  ?R-sided chest tube in place, 1700 mL output. ? ?Allergies: ?Patient has no allergy information on record. ? ?Medications: ?Prior to Admission medications   ?Not on File  ? ? ? ?Vital Signs: ?There were no vitals taken for this visit. ? ?Physical Exam ?NAD, alert ?Chest: right-sided chest tube in place.  Sero-sanguinous output in Pleurvac.  No air leak.  Remains to suction.  ? ?Imaging: ?DG Chest 1 View ? ?Addendum Date: 06/22/2021   ?ADDENDUM REPORT: 06/22/2021 16:37 ADDENDUM: On further review, there is a moderate superior right pneumothorax following interval attempted right thoracentesis. This measures approximately 2.8 cm in craniocaudal dimension. This was discussed with Dr. Roanna Banning of Interventional Radiology at 16:20 hours on 06/22/2021. He reports the patient is coming down to the interventional radiology suite for a chest tube placement. Electronically Signed   By: Neita Garnet M.D.   On: 06/22/2021 16:37  ? ?Result Date: 06/22/2021 ?CLINICAL DATA:  After attempted right-sided thoracentesis. EXAM: CHEST  1 VIEW COMPARISON:  AP chest Jul 17, 2021, CT chest 06/20/2021 FINDINGS: Tracheostomy tube overlies the midline trachea. Cardiac silhouette and mediastinal contours are within normal limits. There is dense heterogeneous airspace opacification within the right mid lung with more diffuse scattered left lung and right upper and lower lung airspace opacities, similar to prior radiograph and CT. Likely small bilateral pleural effusions. No pneumothorax is seen. No acute skeletal abnormality. Left chest wall cardiac pacer is again seen with leads overlying the right atrium and right ventricle. IMPRESSION: Dense airspace consolidation within the right mid lung corresponding to  the dense posterior right upper lobe middle lobe and lower lobe pneumonia with cavitary components seen on prior CT. Additional patchy airspace opacities throughout the left lung. Findings are again consistent with multifocal pneumonia including necrotizing components. Tracheostomy tube in appropriate position. Electronically Signed: By: Neita Garnet M.D. On: 06/22/2021 15:58  ? ?CT CHEST WO CONTRAST ? ?Result Date: 06/20/2021 ?CLINICAL DATA:  Pneumonia EXAM: CT CHEST WITHOUT CONTRAST TECHNIQUE: Multidetector CT imaging of the chest was performed following the standard protocol without IV contrast. RADIATION DOSE REDUCTION: This exam was performed according to the departmental dose-optimization program which includes automated exposure control, adjustment of the mA and/or kV according to patient size and/or use of iterative reconstruction technique. COMPARISON:  None. FINDINGS: Cardiovascular: Aortic atherosclerosis. Normal heart size. Left and right coronary artery calcifications. Gross enlargement of the main pulmonary artery measuring up to 4.2 cm in caliber. No pericardial effusion. Mediastinum/Nodes: Numerous prominent mediastinal lymph nodes. Tracheostomy. Thyroid gland, and esophagus demonstrate no significant findings. Lungs/Pleura: Moderate to large right, small left pleural effusions and associated atelectasis or consolidation. Multiple cavitary lesions within the right lung, most conspicuously a thick-walled lesion in the anterior right upper lobe measuring 3.7 x 2.5 cm (series 5, image 60). Extensive heterogeneous and ground-glass airspace opacity throughout the lungs, more conspicuous on the left. Upper Abdomen: No acute abnormality. Musculoskeletal: No chest wall abnormality. No suspicious osseous lesions identified. IMPRESSION: 1. Moderate to large right, small left pleural effusions and associated atelectasis or consolidation. 2. Multiple cavitary lesions within the right lung, as well as extensive  heterogeneous and ground-glass airspace opacity throughout the lungs bilaterally. Findings are most consistent with multifocal infection, including necrotizing components. 3. Numerous prominent mediastinal lymph nodes, likely reactive. 4. Tracheostomy. 5.  Gross enlargement of the main pulmonary artery, as can seen in pulmonary hypertension. 6. Coronary artery disease. Aortic Atherosclerosis (ICD10-I70.0). Electronically Signed   By: Jearld Lesch M.D.   On: 06/20/2021 12:59  ? ?DG Chest Port 1 View ? ?Result Date: 06/23/2021 ?CLINICAL DATA:  Postprocedure in IR.  Chest tube placement. EXAM: PORTABLE CHEST 1 VIEW COMPARISON:  AP chest 06/22/2021 at 3:46 p.m. FINDINGS: Tracheostomy tube again overlies the superior trachea. The patient is mildly rightward rotated. Cardiac silhouette is at the upper limits of normal size. Mediastinal contours are within normal limits. There is a new small bore right sided chest tube with tip overlying the mid height of the right hemithorax. Likely slight improvement in the prior right apical pneumothorax, measuring approximately 2.3 cm in craniocaudal dimension compared to 2.8 cm previously. There is again dense heterogeneous airspace opacification within the right mid lung with more diffuse scattered left lung and right upper lung and lower lung airspace opacities. Likely small bilateral pleural effusions. Left chest wall cardiac pacer is again seen with leads overlying the right atrium and right ventricle. IMPRESSION:: IMPRESSION: 1. Interval placement of small bore right-sided chest tube with slight improvement in small right apical pneumothorax. 2. Otherwise no significant change in dense airspace consolidation within the right mid lung and additional scattered patchy bilateral lung airspace opacities. Electronically Signed   By: Neita Garnet M.D.   On: 06/23/2021 09:37  ? ?IR IMAGE GUIDED DRAINAGE BY PERCUTANEOUS CATHETER ? ?Result Date: 06/22/2021 ?CLINICAL DATA:  Pneumothorax s/p  aborted thoracentesis EXAM: INSERTION OF NON-TUNNELED RIGHT SIDED PLEURAL CATHETER COMPARISON:  Chest XR, 06/22/2021.  IR ultrasound, 06/22/2021. MEDICATIONS: None ANESTHESIA/SEDATION: Local anesthetic was administered. The patient was continuously monitored during the procedure by the interventional radiology nurse under my direct supervision. FLUOROSCOPY: Fluoroscopic dose; 3 mGy COMPLICATIONS: None immediate. PROCEDURE: The procedure, risks, benefits, and alternatives were explained to the the patient and/or patient's representative, they understand and consent to the procedure. The RIGHT lateral chest and upper abdomen were prepped and draped in a sterile fashion, and a sterile drape was applied covering the operative field. A sterile gown and sterile gloves were used for the procedure. Initial fluoroscopic imaging demonstrates a moderate to large volume RIGHT hydropneumothorax. Under direct fluoroscopic guidance, the inferior lateral pleural space was accessed with a 18 gauge trocar needle after the overlying soft tissues were anesthetized with 1% lidocaine with epinephrine. An stiff wire was then advanced under fluoroscopy into the RIGHT apical pleural space. A 10 Fr non-tunneled pleural drainage was placed. Final catheter positioning was confirmed with a fluoroscopic radiographic a 2-0 Ethilon retention suture was applied at the catheter exit site. The catheter was connected to a pleura vac and negative suction. Dressings were applied. The patient tolerated the above procedure well without immediate postprocedural complication. IMPRESSION: Successful placement of non-tunneled, RIGHT apically-directed 10 Fr thoracostomy tube for pneumothorax, as above. Roanna Banning, MD [facility] Electronically Signed   By: Roanna Banning M.D.   On: 06/22/2021 17:32  ? ?IR THORACENTESIS ASP PLEURAL SPACE W/IMG GUIDE ? ?Result Date: 06/22/2021 ?INDICATION: Patient with  history of pneumonia found to have a right-sided pleural effusion. Request is for therapeutic thoracentesis EXAM: ULTRASOUND GUIDED THERAPEUTIC RIGHT-SIDED THORACENTESIS MEDICATIONS: LIDOCAINE 1% 10 ML CO

## 2021-06-23 NOTE — Progress Notes (Addendum)
Pulmonary Critical Care Medicine ?[place] ?  ?PULMONARY CRITICAL CARE SERVICE ? ?PROGRESS NOTE ? ? ? ? ?Sherry Mcmahon  ?QG:[id number]  ?DOB: 1950-05-09  ? ?DOA: 06/24/2021 ? ?Referring Physician: Satira Sark, MD ? ?HPI: Sherry Mcmahon is a 71 y.o. female being followed for ventilator/airway/oxygen weaning Acute on Chronic Respiratory Failure. Patient seen lying in bed, currently remains on full support.  She underwent right thoracentesis yesterday but unfortunately it was unsuccessful. Post thoracentesis attempt, chest xray with new right moderate superior pneumothorax noted. IR placed a right chest tube today with immediate drainage of 1700 cc. Chest tube remains in place to suction, there is no air leak.  No acute distress, no acute overnight events. ? ?Medications: ?Reviewed on Rounds ? ?Physical Exam: ? ?Vitals: Temp 97.8, pulse 80, respirations 32, BP 117/68, SPO2 97% ? ?Ventilator Settings AC VC, tidal volume 480, FiO2 28%, rate 16, PEEP 5 ? ?General: Comfortable at this time ?Neck: supple ?Cardiovascular: no malignant arrhythmias ?Respiratory: Right lung diminished and left lung clear ?Skin: no rash seen on limited exam ?Musculoskeletal: No gross abnormality ?Psychiatric:unable to assess ?Neurologic:no involuntary movements   ?   ?   ?Lab Data:  ? ?Basic Metabolic Panel: ?Recent Labs  ?Lab 05/30/2021 ?0446 06/21/21 ?0912  ?NA 134* 138  ?K 3.6 4.3  ?CL 105 109  ?CO2 21* 21*  ?GLUCOSE 128* 114*  ?BUN 34* 49*  ?CREATININE 0.65 0.61  ?CALCIUM 9.0 9.4  ?MG 2.5*  --   ? ? ?ABG: ?Recent Labs  ?Lab 06/14/2021 ?0205 06/21/2021 ?0600 06/19/21 ?1405  ?PHART 7.24* 7.24* 7.31*  ?PCO2ART 53* 49* 46  ?PO2ART 86 86 85  ?HCO3 23.0 21.4 23.2  ?O2SAT 98.2 98.9 99  ? ? ?Liver Function Tests: ?Recent Labs  ?Lab 06/12/2021 ?0446 06/21/21 ?0912  ?AST 16 15  ?ALT 17 16  ?ALKPHOS 87 78  ?BILITOT 0.6 0.3  ?PROT 5.5* 5.6*  ?ALBUMIN 1.7* 1.5*  ? ?No results for input(s): LIPASE, AMYLASE in the last 168 hours. ?No results for  input(s): AMMONIA in the last 168 hours. ? ?CBC: ?Recent Labs  ?Lab 06/03/2021 ?0446 06/21/21 ?0912  ?WBC 29.9* 18.3*  ?HGB 9.4* 9.2*  ?HCT 29.0* 30.2*  ?MCV 95.7 97.7  ?PLT 430* 381  ? ? ?Cardiac Enzymes: ?No results for input(s): CKTOTAL, CKMB, CKMBINDEX, TROPONINI in the last 168 hours. ? ?BNP (last 3 results) ?No results for input(s): BNP in the last 8760 hours. ? ?ProBNP (last 3 results) ?No results for input(s): PROBNP in the last 8760 hours. ? ?Radiological Exams: ?DG Chest 1 View ? ?Addendum Date: 06/22/2021   ?ADDENDUM REPORT: 06/22/2021 16:37 ADDENDUM: On further review, there is a moderate superior right pneumothorax following interval attempted right thoracentesis. This measures approximately 2.8 cm in craniocaudal dimension. This was discussed with Dr. Michaelle Birks of Interventional Radiology at 16:20 hours on 06/22/2021. He reports the patient is coming down to the interventional radiology suite for a chest tube placement. Electronically Signed   By: Yvonne Kendall M.D.   On: 06/22/2021 16:37  ? ?Result Date: 06/22/2021 ?CLINICAL DATA:  After attempted right-sided thoracentesis. EXAM: CHEST  1 VIEW COMPARISON:  AP chest 06/17/2021, CT chest 06/20/2021 FINDINGS: Tracheostomy tube overlies the midline trachea. Cardiac silhouette and mediastinal contours are within normal limits. There is dense heterogeneous airspace opacification within the right mid lung with more diffuse scattered left lung and right upper and lower lung airspace opacities, similar to prior radiograph and CT. Likely small bilateral pleural effusions.  No pneumothorax is seen. No acute skeletal abnormality. Left chest wall cardiac pacer is again seen with leads overlying the right atrium and right ventricle. IMPRESSION: Dense airspace consolidation within the right mid lung corresponding to the dense posterior right upper lobe middle lobe and lower lobe pneumonia with cavitary components seen on prior CT. Additional patchy airspace opacities  throughout the left lung. Findings are again consistent with multifocal pneumonia including necrotizing components. Tracheostomy tube in appropriate position. Electronically Signed: By: Yvonne Kendall M.D. On: 06/22/2021 15:58  ? ?DG Chest Port 1 View ? ?Result Date: 06/23/2021 ?CLINICAL DATA:  Postprocedure in IR.  Chest tube placement. EXAM: PORTABLE CHEST 1 VIEW COMPARISON:  AP chest 06/22/2021 at 3:46 p.m. FINDINGS: Tracheostomy tube again overlies the superior trachea. The patient is mildly rightward rotated. Cardiac silhouette is at the upper limits of normal size. Mediastinal contours are within normal limits. There is a new small bore right sided chest tube with tip overlying the mid height of the right hemithorax. Likely slight improvement in the prior right apical pneumothorax, measuring approximately 2.3 cm in craniocaudal dimension compared to 2.8 cm previously. There is again dense heterogeneous airspace opacification within the right mid lung with more diffuse scattered left lung and right upper lung and lower lung airspace opacities. Likely small bilateral pleural effusions. Left chest wall cardiac pacer is again seen with leads overlying the right atrium and right ventricle. IMPRESSION:: IMPRESSION: 1. Interval placement of small bore right-sided chest tube with slight improvement in small right apical pneumothorax. 2. Otherwise no significant change in dense airspace consolidation within the right mid lung and additional scattered patchy bilateral lung airspace opacities. Electronically Signed   By: Yvonne Kendall M.D.   On: 06/23/2021 09:37  ? ?IR IMAGE GUIDED DRAINAGE BY PERCUTANEOUS CATHETER ? ?Result Date: 06/22/2021 ?CLINICAL DATA:  Pneumothorax s/p aborted thoracentesis EXAM: INSERTION OF NON-TUNNELED RIGHT SIDED PLEURAL CATHETER COMPARISON:  Chest XR, 06/22/2021.  IR ultrasound, 06/22/2021. MEDICATIONS: None ANESTHESIA/SEDATION: Local anesthetic was administered. The patient was continuously  monitored during the procedure by the interventional radiology nurse under my direct supervision. FLUOROSCOPY: Fluoroscopic dose; 3 mGy COMPLICATIONS: None immediate. PROCEDURE: The procedure, risks, benefits, and alternatives were explained to the the patient and/or patient's representative, they understand and consent to the procedure. The RIGHT lateral chest and upper abdomen were prepped and draped in a sterile fashion, and a sterile drape was applied covering the operative field. A sterile gown and sterile gloves were used for the procedure. Initial fluoroscopic imaging demonstrates a moderate to large volume RIGHT hydropneumothorax. Under direct fluoroscopic guidance, the inferior lateral pleural space was accessed with a 18 gauge trocar needle after the overlying soft tissues were anesthetized with 1% lidocaine with epinephrine. An stiff wire was then advanced under fluoroscopy into the RIGHT apical pleural space. A 10 Fr non-tunneled pleural drainage was placed. Final catheter positioning was confirmed with a fluoroscopic radiographic a 2-0 Ethilon retention suture was applied at the catheter exit site. The catheter was connected to a pleura vac and negative suction. Dressings were applied. The patient tolerated the above procedure well without immediate postprocedural complication. IMPRESSION: Successful placement of non-tunneled, RIGHT apically-directed 10 Fr thoracostomy tube for pneumothorax, as above. Michaelle Birks, MD [facility] Radiology Electronically Signed   By: Michaelle Birks M.D.   On: 06/22/2021 17:32  ? ?IR THORACENTESIS ASP PLEURAL SPACE W/IMG GUIDE ? ?Result Date: 06/22/2021 ?INDICATION: Patient with history of pneumonia found to have a right-sided pleural effusion.  Request is for therapeutic thoracentesis EXAM: ULTRASOUND GUIDED THERAPEUTIC RIGHT-SIDED THORACENTESIS MEDICATIONS: LIDOCAINE 1% 10 ML COMPLICATIONS: None immediate. PROCEDURE: An  ultrasound guided thoracentesis was thoroughly discussed with the patient and questions answered. The benefits, risks, alternatives and complications were also discussed. The patient understands and wishes to pr

## 2021-06-24 ENCOUNTER — Other Ambulatory Visit (HOSPITAL_COMMUNITY): Payer: Medicare PPO

## 2021-06-24 DIAGNOSIS — J189 Pneumonia, unspecified organism: Secondary | ICD-10-CM | POA: Diagnosis not present

## 2021-06-24 DIAGNOSIS — J9621 Acute and chronic respiratory failure with hypoxia: Secondary | ICD-10-CM | POA: Diagnosis not present

## 2021-06-24 DIAGNOSIS — I5032 Chronic diastolic (congestive) heart failure: Secondary | ICD-10-CM | POA: Diagnosis not present

## 2021-06-24 DIAGNOSIS — J449 Chronic obstructive pulmonary disease, unspecified: Secondary | ICD-10-CM | POA: Diagnosis not present

## 2021-06-24 LAB — BASIC METABOLIC PANEL
Anion gap: 3 — ABNORMAL LOW (ref 5–15)
BUN: 52 mg/dL — ABNORMAL HIGH (ref 8–23)
CO2: 24 mmol/L (ref 22–32)
Calcium: 9.5 mg/dL (ref 8.9–10.3)
Chloride: 112 mmol/L — ABNORMAL HIGH (ref 98–111)
Creatinine, Ser: 0.54 mg/dL (ref 0.44–1.00)
GFR, Estimated: >60 mL/min (ref >60)
Glucose, Bld: 104 mg/dL — ABNORMAL HIGH (ref 70–99)
Potassium: 4.7 mmol/L (ref 3.5–5.1)
Sodium: 139 mmol/L (ref 135–145)

## 2021-06-24 LAB — BLOOD GAS, ARTERIAL
Acid-base deficit: 0.6 mmol/L (ref 0.0–2.0)
Bicarbonate: 25.4 mmol/L (ref 20.0–28.0)
O2 Saturation: 99.1 %
Patient temperature: 36.4
pCO2 arterial: 45 mmHg (ref 32–48)
pH, Arterial: 7.36 (ref 7.35–7.45)
pO2, Arterial: 80 mmHg — ABNORMAL LOW (ref 83–108)

## 2021-06-24 LAB — CBC
HCT: 29.8 % — ABNORMAL LOW (ref 36.0–46.0)
Hemoglobin: 8.9 g/dL — ABNORMAL LOW (ref 12.0–15.0)
MCH: 29.9 pg (ref 26.0–34.0)
MCHC: 29.9 g/dL — ABNORMAL LOW (ref 30.0–36.0)
MCV: 100 fL (ref 80.0–100.0)
Platelets: 289 10*3/uL (ref 150–400)
RBC: 2.98 MIL/uL — ABNORMAL LOW (ref 3.87–5.11)
RDW: 15.9 % — ABNORMAL HIGH (ref 11.5–15.5)
WBC: 16.1 10*3/uL — ABNORMAL HIGH (ref 4.0–10.5)
nRBC: 0 % (ref 0.0–0.2)

## 2021-06-24 LAB — MAGNESIUM: Magnesium: 1.9 mg/dL (ref 1.7–2.4)

## 2021-06-24 NOTE — Progress Notes (Signed)
Supervising Physician: Richarda Overlie  Patient Status:  [facility] - In-pt  Chief Complaint:  Pneumothorax s/p thoracentesis  Subjective:  Pt alert, sitting upright in bed.  Pt responds by shaking head yes/no.  R-sided CT in place with 1900 ml serosanguinous OP in [place] drain.   Allergies: Patient has no allergy information on record.  Medications: Prior to Admission medications   Not on File     Vital Signs: There were no vitals taken for this visit.  Physical Exam Constitutional:      General: She is not in acute distress.    Appearance: She is ill-appearing.  HENT:     Head: Normocephalic and atraumatic.  Eyes:     Extraocular Movements: Extraocular movements intact.     Pupils: Pupils are equal, round, and reactive to light.  Pulmonary:     Effort: Pulmonary effort is normal. No respiratory distress.     Comments: R chest tube in place. Sutures in place. Dressing C/D/I. ~1900 cc serosanguinous OP in [place].  Trach to vent Skin:    General: Skin is warm and dry.  Neurological:     Mental Status: She is alert.    Imaging: DG Chest 1 View  Addendum Date: 06/22/2021   ADDENDUM REPORT: 06/22/2021 16:37 ADDENDUM: On further review, there is a moderate superior right pneumothorax following interval attempted right thoracentesis. This measures approximately 2.8 cm in craniocaudal dimension. This was discussed with Dr. Roanna Banning of Interventional Radiology at 16:20 hours on 06/22/2021. He reports the patient is coming down to the interventional radiology suite for a chest tube placement. Electronically Signed   By: Neita Garnet M.D.   On: 06/22/2021 16:37   Result Date: 06/22/2021 CLINICAL DATA:  After attempted right-sided thoracentesis. EXAM: CHEST  1 VIEW COMPARISON:  AP chest 06-30-2021, CT chest 06/20/2021 FINDINGS: Tracheostomy tube overlies the midline trachea. Cardiac silhouette and mediastinal contours are within normal limits. There is dense heterogeneous airspace  opacification within the right mid lung with more diffuse scattered left lung and right upper and lower lung airspace opacities, similar to prior radiograph and CT. Likely small bilateral pleural effusions. No pneumothorax is seen. No acute skeletal abnormality. Left chest wall cardiac pacer is again seen with leads overlying the right atrium and right ventricle. IMPRESSION: Dense airspace consolidation within the right mid lung corresponding to the dense posterior right upper lobe middle lobe and lower lobe pneumonia with cavitary components seen on prior CT. Additional patchy airspace opacities throughout the left lung. Findings are again consistent with multifocal pneumonia including necrotizing components. Tracheostomy tube in appropriate position. Electronically Signed: By: Neita Garnet M.D. On: 06/22/2021 15:58   CT CHEST WO CONTRAST  Result Date: 06/20/2021 CLINICAL DATA:  Pneumonia EXAM: CT CHEST WITHOUT CONTRAST TECHNIQUE: Multidetector CT imaging of the chest was performed following the standard protocol without IV contrast. RADIATION DOSE REDUCTION: This exam was performed according to the departmental dose-optimization program which includes automated exposure control, adjustment of the mA and/or kV according to patient size and/or use of iterative reconstruction technique. COMPARISON:  None. FINDINGS: Cardiovascular: Aortic atherosclerosis. Normal heart size. Left and right coronary artery calcifications. Gross enlargement of the main pulmonary artery measuring up to 4.2 cm in caliber. No pericardial effusion. Mediastinum/Nodes: Numerous prominent mediastinal lymph nodes. Tracheostomy. Thyroid gland, and esophagus demonstrate no significant findings. Lungs/Pleura: Moderate to large right, small left pleural effusions and associated atelectasis or consolidation. Multiple cavitary lesions within the right lung, most conspicuously a thick-walled lesion in the  anterior right upper lobe measuring 3.7 x  2.5 cm (series 5, image 60). Extensive heterogeneous and ground-glass airspace opacity throughout the lungs, more conspicuous on the left. Upper Abdomen: No acute abnormality. Musculoskeletal: No chest wall abnormality. No suspicious osseous lesions identified. IMPRESSION: 1. Moderate to large right, small left pleural effusions and associated atelectasis or consolidation. 2. Multiple cavitary lesions within the right lung, as well as extensive heterogeneous and ground-glass airspace opacity throughout the lungs bilaterally. Findings are most consistent with multifocal infection, including necrotizing components. 3. Numerous prominent mediastinal lymph nodes, likely reactive. 4. Tracheostomy. 5. Gross enlargement of the main pulmonary artery, as can seen in pulmonary hypertension. 6. Coronary artery disease. Aortic Atherosclerosis (ICD10-I70.0). Electronically Signed   By: Jearld Lesch M.D.   On: 06/20/2021 12:59   DG Chest Port 1 View  Result Date: 06/24/2021 CLINICAL DATA:  71 year old female with history of chest tube placement. EXAM: PORTABLE CHEST 1 VIEW COMPARISON:  Chest x-ray 06/23/2021. FINDINGS: A tracheostomy tube is in place with tip 7.8 cm above the carina. Right-sided small bore chest tube with tip projecting over the lower right hemithorax. Persistent small right apical pneumothorax, similar to the prior study. No left-sided pneumothorax. Patchy multifocal interstitial prominence an ill-defined airspace disease noted throughout the left lung, with more dense consolidative changes in the right mid lung which have worsened compared to the prior study. Moderate right and small left pleural effusions. Heart size is borderline enlarged. The patient is rotated to the right on today's exam, resulting in distortion of the mediastinal contours and reduced diagnostic sensitivity and specificity for mediastinal pathology. Left-sided pacemaker device in place with lead tips projecting over the right atrium  and right ventricle. IMPRESSION: 1. Support apparatus, as above. 2. Severe multilobar bilateral pneumonia (right greater than left), worsened compared to the prior examination. 3. Stable right-sided hydropneumothorax with small pneumothorax component and moderate pleural effusion component. Small left pleural effusion is unchanged. Electronically Signed   By: Trudie Reed M.D.   On: 06/24/2021 05:24   DG Chest Port 1 View  Result Date: 06/23/2021 CLINICAL DATA:  Postprocedure in IR.  Chest tube placement. EXAM: PORTABLE CHEST 1 VIEW COMPARISON:  AP chest 06/22/2021 at 3:46 p.m. FINDINGS: Tracheostomy tube again overlies the superior trachea. The patient is mildly rightward rotated. Cardiac silhouette is at the upper limits of normal size. Mediastinal contours are within normal limits. There is a new small bore right sided chest tube with tip overlying the mid height of the right hemithorax. Likely slight improvement in the prior right apical pneumothorax, measuring approximately 2.3 cm in craniocaudal dimension compared to 2.8 cm previously. There is again dense heterogeneous airspace opacification within the right mid lung with more diffuse scattered left lung and right upper lung and lower lung airspace opacities. Likely small bilateral pleural effusions. Left chest wall cardiac pacer is again seen with leads overlying the right atrium and right ventricle. IMPRESSION:: IMPRESSION: 1. Interval placement of small bore right-sided chest tube with slight improvement in small right apical pneumothorax. 2. Otherwise no significant change in dense airspace consolidation within the right mid lung and additional scattered patchy bilateral lung airspace opacities. Electronically Signed   By: Neita Garnet M.D.   On: 06/23/2021 09:37   IR IMAGE GUIDED DRAINAGE BY PERCUTANEOUS CATHETER  Result Date: 06/22/2021 CLINICAL DATA:  Pneumothorax s/p aborted thoracentesis EXAM: INSERTION OF NON-TUNNELED RIGHT SIDED  PLEURAL CATHETER COMPARISON:  Chest XR, 06/22/2021.  IR ultrasound, 06/22/2021. MEDICATIONS: None ANESTHESIA/SEDATION: Local anesthetic was administered.  The patient was continuously monitored during the procedure by the interventional radiology nurse under my direct supervision. FLUOROSCOPY: Fluoroscopic dose; 3 mGy COMPLICATIONS: None immediate. PROCEDURE: The procedure, risks, benefits, and alternatives were explained to the the patient and/or patient's representative, they understand and consent to the procedure. The RIGHT lateral chest and upper abdomen were prepped and draped in a sterile fashion, and a sterile drape was applied covering the operative field. A sterile gown and sterile gloves were used for the procedure. Initial fluoroscopic imaging demonstrates a moderate to large volume RIGHT hydropneumothorax. Under direct fluoroscopic guidance, the inferior lateral pleural space was accessed with a 18 gauge trocar needle after the overlying soft tissues were anesthetized with 1% lidocaine with epinephrine. An stiff wire was then advanced under fluoroscopy into the RIGHT apical pleural space. A 10 Fr non-tunneled pleural drainage was placed. Final catheter positioning was confirmed with a fluoroscopic radiographic a 2-0 Ethilon retention suture was applied at the catheter exit site. The catheter was connected to a pleura vac and negative suction. Dressings were applied. The patient tolerated the above procedure well without immediate postprocedural complication. IMPRESSION: Successful placement of non-tunneled, RIGHT apically-directed 10 Fr thoracostomy tube for pneumothorax, as above. Roanna Banning, MD [facility] Radiology Electronically Signed   By: Roanna Banning M.D.   On: 06/22/2021 17:32   IR THORACENTESIS ASP PLEURAL SPACE W/IMG GUIDE  Result Date: 06/22/2021 INDICATION: Patient with history of pneumonia found to have a right-sided pleural effusion.  Request is for therapeutic thoracentesis EXAM: ULTRASOUND GUIDED THERAPEUTIC RIGHT-SIDED THORACENTESIS MEDICATIONS: LIDOCAINE 1% 10 ML COMPLICATIONS: None immediate. PROCEDURE: An ultrasound guided thoracentesis was thoroughly discussed with the patient and questions answered. The benefits, risks, alternatives and complications were also discussed. The patient understands and wishes to proceed with the procedure. Written consent was obtained. Ultrasound was performed to localize and mark an adequate pocket of fluid in the RIGHT chest. The area was then prepped and draped in the normal sterile fashion. 1% Lidocaine was used for local anesthesia. Under ultrasound guidance a 6 Fr Safe-T-Centesis catheter was introduced. Due to patient's positioning and inability to remain in appropriate position the procedure was unsuccessful. Catheter was removed and a dressing applied. FINDINGS: Due to patient's positioning and inability to remain in appropriate position the procedure was unsuccessful. IMPRESSION: Unsuccessful, aborted ultrasound guided RIGHT thoracentesis. Read by: Anders Grant, NP PLAN: Postprocedural chest XR with moderate-to-large volume RIGHT pneumothorax. Patient on POSITIVE pressure ventilation. A RIGHT thoracostomy tube is recommended and is to be placed shortly. Roanna Banning, MD [facility] Radiology Electronically Signed   By: Roanna Banning M.D.   On: 06/22/2021 16:32    Labs:  CBC: Recent Labs    06/18/21 0446 06/21/21 0912 06/24/21 0435  WBC 29.9* 18.3* 16.1*  HGB 9.4* 9.2* 8.9*  HCT 29.0* 30.2* 29.8*  PLT 430* 381 289    COAGS: Recent Labs    06/18/21 0446  INR 1.4*    BMP: Recent Labs    06/18/21 0446 06/21/21 0912 06/24/21 0435  NA 134* 138 139  K 3.6 4.3 4.7  CL 105 109 112*  CO2 21* 21* 24  GLUCOSE 128* 114* 104*  BUN 34* 49* 52*  CALCIUM 9.0 9.4 9.5  CREATININE 0.65 0.61 0.54  GFRNONAA >60 >60 >60    LIVER  FUNCTION TESTS: Recent Labs    06/18/21 0446 06/21/21 0912  BILITOT 0.6 0.3  AST 16 15  ALT 17 16  ALKPHOS  87 78  PROT 5.5* 5.6*  ALBUMIN 1.7* 1.5*    Assessment and Plan:  Pneumothorax s/p right thoracentesis 4/25 with subsequent chest tube placement Pt alert, sitting upright in bed.  Pt responds by shaking head yes/no.  R-sided CT in place with 1900 ml serosanguinous OP in [place] drain.  CXR today shows stable right apical pneumothorax unchanged from yesterdays study.   Electronically Signed: Shon Hough, NP 06/24/2021, 11:14 AM   I spent a total of 15 Minutes at the the patient's bedside AND on the patient's hospital floor or unit, greater than 50% of which was counseling/coordinating care for pneumothorax s/p right thoracentesis.

## 2021-06-24 NOTE — Progress Notes (Signed)
Pulmonary Critical Care Medicine ?[facility] [place] ?  ?PULMONARY CRITICAL CARE SERVICE ? ?PROGRESS NOTE ? ? ? ? ?[name]  ?ZOX:[id number]  ?DOB: 1950/04/01  ? ?DOA: 06/20/2021 ? ?Referring Physician: Luna Kitchens, MD ? ?HPI: Sherry Mcmahon is a 71 y.o. female being followed for ventilator/airway/oxygen weaning Acute on Chronic Respiratory Failure.  Patient had thoracentesis attempted yesterday and ended up with a pneumothorax.  Chest tube was placed.  This morning the chest x-ray was personally reviewed by me shows still presence of an apical pneumothorax with tube placement was also reviewed and the lower lung zone has been draining fluid ? ?Medications: ?Reviewed on Rounds ? ?Physical Exam: ? ?Vitals: Temperature is 97.4 pulse 80 respiratory 21 blood pressure 147/70 saturations 100% ? ?Ventilator Settings on assist control FiO2 is 28% tidal volume 400 PEEP of 5 ? ?General: Comfortable at this time ?Neck: supple ?Cardiovascular: no malignant arrhythmias ?Respiratory: Scattered rhonchi expansion is equal ?Skin: no rash seen on limited exam ?Musculoskeletal: No gross abnormality ?Psychiatric:unable to assess ?Neurologic:no involuntary movements   ?   ?   ?Lab Data:  ? ?Basic Metabolic Panel: ?Recent Labs  ?Lab 06/22/2021 ?0446 06/21/21 ?0912 06/24/21 ?0435  ?NA 134* 138 139  ?K 3.6 4.3 4.7  ?CL 105 109 112*  ?CO2 21* 21* 24  ?GLUCOSE 128* 114* 104*  ?BUN 34* 49* 52*  ?CREATININE 0.65 0.61 0.54  ?CALCIUM 9.0 9.4 9.5  ?MG 2.5*  --  1.9  ? ? ?ABG: ?Recent Labs  ?Lab 05/29/2021 ?0205 06/12/2021 ?0600 06/19/21 ?1405 06/24/21 ?1012  ?PHART 7.24* 7.24* 7.31* 7.36  ?PCO2ART 53* 49* 46 45  ?PO2ART 86 86 85 80*  ?HCO3 23.0 21.4 23.2 25.4  ?O2SAT 98.2 98.9 99 99.1  ? ? ?Liver Function Tests: ?Recent Labs  ?Lab 05/29/2021 ?0446 06/21/21 ?0912  ?AST 16 15  ?ALT 17 16  ?ALKPHOS 87 78  ?BILITOT 0.6 0.3  ?PROT 5.5* 5.6*  ?ALBUMIN 1.7* 1.5*  ? ?No results for input(s): LIPASE, AMYLASE in the last 168 hours. ?No results for  input(s): AMMONIA in the last 168 hours. ? ?CBC: ?Recent Labs  ?Lab 06/14/2021 ?0446 06/21/21 ?0912 06/24/21 ?0435  ?WBC 29.9* 18.3* 16.1*  ?HGB 9.4* 9.2* 8.9*  ?HCT 29.0* 30.2* 29.8*  ?MCV 95.7 97.7 100.0  ?PLT 430* 381 289  ? ? ?Cardiac Enzymes: ?No results for input(s): CKTOTAL, CKMB, CKMBINDEX, TROPONINI in the last 168 hours. ? ?BNP (last 3 results) ?No results for input(s): BNP in the last 8760 hours. ? ?ProBNP (last 3 results) ?No results for input(s): PROBNP in the last 8760 hours. ? ?Radiological Exams: ?DG Chest 1 View ? ?Addendum Date: 06/22/2021   ?ADDENDUM REPORT: 06/22/2021 16:37 ADDENDUM: On further review, there is a moderate superior right pneumothorax following interval attempted right thoracentesis. This measures approximately 2.8 cm in craniocaudal dimension. This was discussed with Dr. Roanna Banning of Interventional Radiology at 16:20 hours on 06/22/2021. He reports the patient is coming down to the interventional radiology suite for a chest tube placement. Electronically Signed   By: Neita Garnet M.D.   On: 06/22/2021 16:37  ? ?Result Date: 06/22/2021 ?CLINICAL DATA:  After attempted right-sided thoracentesis. EXAM: CHEST  1 VIEW COMPARISON:  AP chest 06/16/2021, CT chest 06/20/2021 FINDINGS: Tracheostomy tube overlies the midline trachea. Cardiac silhouette and mediastinal contours are within normal limits. There is dense heterogeneous airspace opacification within the right mid lung with more diffuse scattered left lung and right upper and lower lung airspace opacities, similar  to prior radiograph and CT. Likely small bilateral pleural effusions. No pneumothorax is seen. No acute skeletal abnormality. Left chest wall cardiac pacer is again seen with leads overlying the right atrium and right ventricle. IMPRESSION: Dense airspace consolidation within the right mid lung corresponding to the dense posterior right upper lobe middle lobe and lower lobe pneumonia with cavitary components seen on prior  CT. Additional patchy airspace opacities throughout the left lung. Findings are again consistent with multifocal pneumonia including necrotizing components. Tracheostomy tube in appropriate position. Electronically Signed: By: Neita Garnetonald  Viola M.D. On: 06/22/2021 15:58  ? ?DG Chest Port 1 View ? ?Result Date: 06/24/2021 ?CLINICAL DATA:  71 year old female with history of chest tube placement. EXAM: PORTABLE CHEST 1 VIEW COMPARISON:  Chest x-ray 06/23/2021. FINDINGS: A tracheostomy tube is in place with tip 7.8 cm above the carina. Right-sided small bore chest tube with tip projecting over the lower right hemithorax. Persistent small right apical pneumothorax, similar to the prior study. No left-sided pneumothorax. Patchy multifocal interstitial prominence an ill-defined airspace disease noted throughout the left lung, with more dense consolidative changes in the right mid lung which have worsened compared to the prior study. Moderate right and small left pleural effusions. Heart size is borderline enlarged. The patient is rotated to the right on today's exam, resulting in distortion of the mediastinal contours and reduced diagnostic sensitivity and specificity for mediastinal pathology. Left-sided pacemaker device in place with lead tips projecting over the right atrium and right ventricle. IMPRESSION: 1. Support apparatus, as above. 2. Severe multilobar bilateral pneumonia (right greater than left), worsened compared to the prior examination. 3. Stable right-sided hydropneumothorax with small pneumothorax component and moderate pleural effusion component. Small left pleural effusion is unchanged. Electronically Signed   By: Trudie Reedaniel  Entrikin M.D.   On: 06/24/2021 05:24  ? ?DG Chest Port 1 View ? ?Result Date: 06/23/2021 ?CLINICAL DATA:  Postprocedure in IR.  Chest tube placement. EXAM: PORTABLE CHEST 1 VIEW COMPARISON:  AP chest 06/22/2021 at 3:46 p.m. FINDINGS: Tracheostomy tube again overlies the superior trachea. The  patient is mildly rightward rotated. Cardiac silhouette is at the upper limits of normal size. Mediastinal contours are within normal limits. There is a new small bore right sided chest tube with tip overlying the mid height of the right hemithorax. Likely slight improvement in the prior right apical pneumothorax, measuring approximately 2.3 cm in craniocaudal dimension compared to 2.8 cm previously. There is again dense heterogeneous airspace opacification within the right mid lung with more diffuse scattered left lung and right upper lung and lower lung airspace opacities. Likely small bilateral pleural effusions. Left chest wall cardiac pacer is again seen with leads overlying the right atrium and right ventricle. IMPRESSION:: IMPRESSION: 1. Interval placement of small bore right-sided chest tube with slight improvement in small right apical pneumothorax. 2. Otherwise no significant change in dense airspace consolidation within the right mid lung and additional scattered patchy bilateral lung airspace opacities. Electronically Signed   By: Neita Garnetonald  Viola M.D.   On: 06/23/2021 09:37  ? ?IR IMAGE GUIDED DRAINAGE BY PERCUTANEOUS CATHETER ? ?Result Date: 06/22/2021 ?CLINICAL DATA:  Pneumothorax s/p aborted thoracentesis EXAM: INSERTION OF NON-TUNNELED RIGHT SIDED PLEURAL CATHETER COMPARISON:  Chest XR, 06/22/2021.  IR ultrasound, 06/22/2021. MEDICATIONS: None ANESTHESIA/SEDATION: Local anesthetic was administered. The patient was continuously monitored during the procedure by the interventional radiology nurse under my direct supervision. FLUOROSCOPY: Fluoroscopic dose; 3 mGy COMPLICATIONS: None immediate. PROCEDURE: The procedure, risks, benefits, and alternatives were explained to the  the patient and/or patient's representative, they understand and consent to the procedure. The RIGHT lateral chest and upper abdomen were prepped and draped in a sterile fashion, and a sterile drape was applied covering the operative  field. A sterile gown and sterile gloves were used for the procedure. Initial fluoroscopic imaging demonstrates a moderate to large volume RIGHT hydropneumothorax. Under direct fluoroscopic guidance, the inf

## 2021-06-24 NOTE — Consult Note (Signed)
? ?Chief Complaint: ?Patient was seen in consultation today for right chest tube evaluation at the request of Luna Kitchens  ? ?Referring Physician(s): Luna Kitchens  ? ?Supervising Physician: Richarda Overlie ? ?Patient Status: [facility] ? ?History of Present Illness: ?Sherry Mcmahon is a 71 y.o. female with PMHs not in file.  ? ?Per Dr. Santo Held note:  ?71 year old caucasian female with past medical history of COPD, etoh abuse, medication non-compliance, paroxysmal afib, pacemaker, chronic diastolic heart failure, 40 year history of smoking, hypertension, meningitis, osteopenia, spinal stenosis, depression, intracranial injury with surgical intervention. This patient presented initially presented to acute hospital with metabolic encephalology and increased shortness of breath. She was admitted for COPD exacerbation and acute on chronic respiratory failure with hypoxia and hypercapnia. Her hospital course was complicated bu septic shock, AKI, cardiogenic shock, metabolic acidosis, thrombocytopenia, and aspiration pneumonia. She subsequently was intubated for further respiratory distress on March 22nd. Blood cultures, urine culture and sputum were positive for Ecoli. Despite being on Vanco, meropenem, her leukocytosis remained elevated, and was placed on Micafungin. Her AKI further got worse requiring HD to be initiated 03/25. She underwent trach placement 04/10, with PEG placement on 4/11. She was transferred to [facility] for further vent weaning. On arrival her ABG remained consistent with metabiotic acidosis and her white count was significantly elevated. At this time she will remain on full support without weaning, underlying acidosis needs to be managed. I have placed an order for lactic acid and CT of the chest for further evaluation.  ? ?Patient underwent attempted right thoracentesis on 4/25, post thora CXR showed right PTX, patient underwent 10 Fr right chest tube placement by Dr. Milford Cage.  ?The chest tube is  functioning well; however, Dr. Manson Passey was contacted by Dr. Park Breed from pulmonary who states that the chest tube may need to be repositioned.  ? ?Discussed with Dr. Lowella Dandy who recommend CT chest for further evaluation.   ?CT chest reviewed by Dr. Lowella Dandy,  IR will exchange and upsize of right chest tube for better control of the right PTX, however, patient has significant pulmonary diease with possible trapped right lung, unclear if the right lung would be able to re-expand and patient may benefit from cardiothoracic surgery consult when possible.  ? ?Patient laying in bed, not in acute distress.  ?On vent via trach.  ?Shakes her head when asked if she is in pain, or has any complaints.  ? ?History reviewed. No pertinent past medical history. ? ?Past Surgical History:  ?Procedure Laterality Date  ? IR THORACENTESIS ASP PLEURAL SPACE W/IMG GUIDE  06/22/2021  ? ? ?Allergies: ?Amlodipine ? ?Medications: ?Prior to Admission medications   ?Not on File  ?  ? ?History reviewed. No pertinent family history. ? ?Social History  ? ?Socioeconomic History  ? Marital status: Single  ?  Spouse name: Not on file  ? Number of children: Not on file  ? Years of education: Not on file  ? Highest education level: Not on file  ?Occupational History  ? Not on file  ?Tobacco Use  ? Smoking status: Not on file  ? Smokeless tobacco: Not on file  ?Substance and Sexual Activity  ? Alcohol use: Not on file  ? Drug use: Not on file  ? Sexual activity: Not on file  ?Other Topics Concern  ? Not on file  ?Social History Narrative  ? Not on file  ? ?Social Determinants of Health  ? ?Financial Resource Strain: Not on file  ?Food Insecurity:  Not on file  ?Transportation Needs: Not on file  ?Physical Activity: Not on file  ?Stress: Not on file  ?Social Connections: Not on file  ? ? ? ?Review of Systems: A 12 point ROS discussed and pertinent positives are indicated in the HPI above.  All other systems are negative. ? ?Vital Signs: ?There were no vitals taken for  this visit. ? ? ?Physical Exam ?Vitals reviewed.  ?Constitutional:   ?   General: She is not in acute distress. ?   Appearance: She is ill-appearing.  ?HENT:  ?   Head: Normocephalic.  ?Neck:  ?   Comments: Trach  ?Cardiovascular:  ?   Rate and Rhythm: Normal rate and regular rhythm.  ?   Heart sounds: Normal heart sounds.  ?Pulmonary:  ?   Comments: On vent ?Crackles throughout the lung ?Skin: ?   General: Skin is warm and dry.  ?Neurological:  ?   Mental Status: She is alert.  ? ? ?MD Evaluation ?Airway: WNL ?Heart: WNL ?Abdomen: WNL ?Chest/ Lungs: WNL ?ASA  Classification: 3 ?Mallampati/Airway Score: Two ? ?Imaging: ?DG Chest 1 View ? ?Addendum Date: 06/22/2021   ?ADDENDUM REPORT: 06/22/2021 16:37 ADDENDUM: On further review, there is a moderate superior right pneumothorax following interval attempted right thoracentesis. This measures approximately 2.8 cm in craniocaudal dimension. This was discussed with Dr. Roanna BanningJon Mugweru of Interventional Radiology at 16:20 hours on 06/22/2021. He reports the patient is coming down to the interventional radiology suite for a chest tube placement. Electronically Signed   By: Neita Garnetonald  Viola M.D.   On: 06/22/2021 16:37  ? ?Result Date: 06/22/2021 ?CLINICAL DATA:  After attempted right-sided thoracentesis. EXAM: CHEST  1 VIEW COMPARISON:  AP chest 02-05-2022, CT chest 06/20/2021 FINDINGS: Tracheostomy tube overlies the midline trachea. Cardiac silhouette and mediastinal contours are within normal limits. There is dense heterogeneous airspace opacification within the right mid lung with more diffuse scattered left lung and right upper and lower lung airspace opacities, similar to prior radiograph and CT. Likely small bilateral pleural effusions. No pneumothorax is seen. No acute skeletal abnormality. Left chest wall cardiac pacer is again seen with leads overlying the right atrium and right ventricle. IMPRESSION: Dense airspace consolidation within the right mid lung corresponding to the  dense posterior right upper lobe middle lobe and lower lobe pneumonia with cavitary components seen on prior CT. Additional patchy airspace opacities throughout the left lung. Findings are again consistent with multifocal pneumonia including necrotizing components. Tracheostomy tube in appropriate position. Electronically Signed: By: Neita Garnetonald  Viola M.D. On: 06/22/2021 15:58  ? ?CT CHEST WO CONTRAST ? ?Result Date: 06/24/2021 ?CLINICAL DATA:  Evaluate right chest tube position EXAM: CT CHEST WITHOUT CONTRAST TECHNIQUE: Multidetector CT imaging of the chest was performed following the standard protocol without IV contrast. RADIATION DOSE REDUCTION: This exam was performed according to the departmental dose-optimization program which includes automated exposure control, adjustment of the mA and/or kV according to patient size and/or use of iterative reconstruction technique. COMPARISON:  06/20/2021 FINDINGS: Cardiovascular: Tracheostomy cannula in appropriate position. Dual lead left chest wall pacemaker is noted. Gastrostomy tube is appropriately position. Diffuse body wall edema is present. Heart size is within normal limits. Minimal pericardial effusion is present. Coronary artery calcifications are noted. Main pulmonary artery is dilated measuring up to 3.8 cm suggestive of pulmonary artery hypertension. Mediastinum/Nodes: Multiple mildly enlarged mediastinal lymph nodes, unchanged from prior examination. No enlarged axillary lymph nodes. Lungs/Pleura: Significant interval reduction of right pleural effusion. Right chest tube is appropriately  position terminating in the posterior pleural space. Diffuse airspace opacity of the right lower lobe is not significantly changed from prior examination. Minimal right pneumothorax is now present, likely due to trapped lung. Scattered ground-glass opacities seen in the left lung consistent with additional sites of infection have improved since prior study. Upper Abdomen:  Minimal perisplenic fluid is present. Cholecystectomy changes are seen. Musculoskeletal: Multiple bilateral rib deformities consistent with remote fractures. IMPRESSION: 1. Right chest tube is appropriately posi

## 2021-06-25 ENCOUNTER — Other Ambulatory Visit (HOSPITAL_COMMUNITY): Payer: Medicare PPO

## 2021-06-25 DIAGNOSIS — J9621 Acute and chronic respiratory failure with hypoxia: Secondary | ICD-10-CM | POA: Diagnosis not present

## 2021-06-25 DIAGNOSIS — J189 Pneumonia, unspecified organism: Secondary | ICD-10-CM | POA: Diagnosis not present

## 2021-06-25 DIAGNOSIS — I5032 Chronic diastolic (congestive) heart failure: Secondary | ICD-10-CM | POA: Diagnosis not present

## 2021-06-25 DIAGNOSIS — J449 Chronic obstructive pulmonary disease, unspecified: Secondary | ICD-10-CM | POA: Diagnosis not present

## 2021-06-25 HISTORY — PX: IR CATHETER TUBE CHANGE: IMG717

## 2021-06-25 MED ORDER — FENTANYL CITRATE (PF) 100 MCG/2ML IJ SOLN
INTRAMUSCULAR | Status: AC
  Filled 2021-06-25: qty 2

## 2021-06-25 MED ORDER — LIDOCAINE HCL 1 % IJ SOLN
INTRAMUSCULAR | Status: AC
  Filled 2021-06-25: qty 20

## 2021-06-25 MED ORDER — MIDAZOLAM HCL 2 MG/2ML IJ SOLN
INTRAMUSCULAR | Status: AC
  Filled 2021-06-25: qty 2

## 2021-06-25 NOTE — Progress Notes (Signed)
Pulmonary Critical Care Medicine ?[facility] [place] ?  ?PULMONARY CRITICAL CARE SERVICE ? ?PROGRESS NOTE ? ? ? ? ?Sherry Mcmahon  ?XTK:[id number]  ?DOB: 10-09-1950  ? ?DOA: 07/06/21 ? ?Referring Physician: Luna Kitchens, MD ? ?HPI: Sherry Mcmahon is a 71 y.o. female being followed for ventilator/airway/oxygen weaning Acute on Chronic Respiratory Failure.  Patient currently is on the ventilator and full support has been on assist control mode ? ?Medications: ?Reviewed on Rounds ? ?Physical Exam: ? ?Vitals: Temperature is 97.4 pulse of 80 respiratory 18 blood pressure is 105/54 saturations 96% ? ?Ventilator Settings on assist control FiO2 28% tidal volumes 400 PEEP 5 ? ?General: Comfortable at this time ?Neck: supple ?Cardiovascular: no malignant arrhythmias ?Respiratory: Coarse rhonchi expansion is equal ?Skin: no rash seen on limited exam ?Musculoskeletal: No gross abnormality ?Psychiatric:unable to assess ?Neurologic:no involuntary movements   ?   ?   ?Lab Data:  ? ?Basic Metabolic Panel: ?Recent Labs  ?Lab 06/21/21 ?0912 06/24/21 ?0435  ?NA 138 139  ?K 4.3 4.7  ?CL 109 112*  ?CO2 21* 24  ?GLUCOSE 114* 104*  ?BUN 49* 52*  ?CREATININE 0.61 0.54  ?CALCIUM 9.4 9.5  ?MG  --  1.9  ? ? ?ABG: ?Recent Labs  ?Lab 06/19/21 ?1405 06/24/21 ?1012  ?PHART 7.31* 7.36  ?PCO2ART 46 45  ?PO2ART 85 80*  ?HCO3 23.2 25.4  ?O2SAT 99 99.1  ? ? ?Liver Function Tests: ?Recent Labs  ?Lab 06/21/21 ?0912  ?AST 15  ?ALT 16  ?ALKPHOS 78  ?BILITOT 0.3  ?PROT 5.6*  ?ALBUMIN 1.5*  ? ?No results for input(s): LIPASE, AMYLASE in the last 168 hours. ?No results for input(s): AMMONIA in the last 168 hours. ? ?CBC: ?Recent Labs  ?Lab 06/21/21 ?0912 06/24/21 ?0435  ?WBC 18.3* 16.1*  ?HGB 9.2* 8.9*  ?HCT 30.2* 29.8*  ?MCV 97.7 100.0  ?PLT 381 289  ? ? ?Cardiac Enzymes: ?No results for input(s): CKTOTAL, CKMB, CKMBINDEX, TROPONINI in the last 168 hours. ? ?BNP (last 3 results) ?No results for input(s): BNP in the last 8760 hours. ? ?ProBNP (last 3  results) ?No results for input(s): PROBNP in the last 8760 hours. ? ?Radiological Exams: ?CT CHEST WO CONTRAST ? ?Result Date: 06/24/2021 ?CLINICAL DATA:  Evaluate right chest tube position EXAM: CT CHEST WITHOUT CONTRAST TECHNIQUE: Multidetector CT imaging of the chest was performed following the standard protocol without IV contrast. RADIATION DOSE REDUCTION: This exam was performed according to the departmental dose-optimization program which includes automated exposure control, adjustment of the mA and/or kV according to patient size and/or use of iterative reconstruction technique. COMPARISON:  06/20/2021 FINDINGS: Cardiovascular: Tracheostomy cannula in appropriate position. Dual lead left chest wall pacemaker is noted. Gastrostomy tube is appropriately position. Diffuse body wall edema is present. Heart size is within normal limits. Minimal pericardial effusion is present. Coronary artery calcifications are noted. Main pulmonary artery is dilated measuring up to 3.8 cm suggestive of pulmonary artery hypertension. Mediastinum/Nodes: Multiple mildly enlarged mediastinal lymph nodes, unchanged from prior examination. No enlarged axillary lymph nodes. Lungs/Pleura: Significant interval reduction of right pleural effusion. Right chest tube is appropriately position terminating in the posterior pleural space. Diffuse airspace opacity of the right lower lobe is not significantly changed from prior examination. Minimal right pneumothorax is now present, likely due to trapped lung. Scattered ground-glass opacities seen in the left lung consistent with additional sites of infection have improved since prior study. Upper Abdomen: Minimal perisplenic fluid is present. Cholecystectomy changes are seen. Musculoskeletal: Multiple  bilateral rib deformities consistent with remote fractures. IMPRESSION: 1. Right chest tube is appropriately position within the posterior right pleural space. 2. Minimal right pneumothorax is  likely related to trapped lung. 3. Dense airspace opacity of the right lower lobe is unchanged from prior examination and likely secondary to pneumonia. Thick-walled cavitary structure in the right upper lobe is unchanged from prior examination. 4. Interval improvement of multifocal airspace opacity in the left lung likely due to resolving pneumonia. 5. Interval development of minimal perisplenic ascites. Electronically Signed   By: Acquanetta BellingFarhaan  Mir M.D.   On: 06/24/2021 14:06  ? ?IR Catheter Tube Change ? ?Result Date: 06/25/2021 ?INDICATION: 71 year old female with pneumonia, empyema, extensive lung changes and non improving pneumothorax. A 10 French pigtail thoracostomy tube was placed on 06/22/2021. Due to persistent pneumothorax, patient presents for tube upsize and repositioning. EXAM: Chest tube exchange and repositioning. MEDICATIONS: None. ANESTHESIA/SEDATION: None. COMPLICATIONS: None immediate. PROCEDURE: Informed written consent was obtained from the patient after a thorough discussion of the procedural risks, benefits and alternatives. All questions were addressed. Maximal Sterile Barrier Technique was utilized including caps, mask, sterile gowns, sterile gloves, sterile drape, hand hygiene and skin antiseptic. A timeout was performed prior to the initiation of the procedure. The existing pigtail catheter was freed from the retention suture, transected and removed over a wire. A 5 French catheter was introduced into the thoracic cavity over the wire and the wire was navigated up into the right lung apex. A 14 French cook all-purpose drainage catheter was modified with additional sideholes and then advanced over the wire and into the right lung apex. When the catheter was formed, it fall back down into the region of the mid lung. However, the catheter is hubbed and cannot be advanced any further. There was return of copious pleural fluid. The catheter was secured to the skin with 0 silk suture. Bandages were  applied. IMPRESSION: Successful exchange for a larger 14 French thoracostomy tube which was advanced further into the thorax directed toward the lung apex. Electronically Signed   By: Malachy MoanHeath  McCullough M.D.   On: 06/25/2021 15:04  ? ?DG Chest Port 1 View ? ?Result Date: 06/24/2021 ?CLINICAL DATA:  71 year old female with history of chest tube placement. EXAM: PORTABLE CHEST 1 VIEW COMPARISON:  Chest x-ray 06/23/2021. FINDINGS: A tracheostomy tube is in place with tip 7.8 cm above the carina. Right-sided small bore chest tube with tip projecting over the lower right hemithorax. Persistent small right apical pneumothorax, similar to the prior study. No left-sided pneumothorax. Patchy multifocal interstitial prominence an ill-defined airspace disease noted throughout the left lung, with more dense consolidative changes in the right mid lung which have worsened compared to the prior study. Moderate right and small left pleural effusions. Heart size is borderline enlarged. The patient is rotated to the right on today's exam, resulting in distortion of the mediastinal contours and reduced diagnostic sensitivity and specificity for mediastinal pathology. Left-sided pacemaker device in place with lead tips projecting over the right atrium and right ventricle. IMPRESSION: 1. Support apparatus, as above. 2. Severe multilobar bilateral pneumonia (right greater than left), worsened compared to the prior examination. 3. Stable right-sided hydropneumothorax with small pneumothorax component and moderate pleural effusion component. Small left pleural effusion is unchanged. Electronically Signed   By: Trudie Reedaniel  Entrikin M.D.   On: 06/24/2021 05:24   ? ?Assessment/Plan ?Active Problems: ?  Acute on chronic respiratory failure with hypoxia (HCC) ?  CAP (community acquired pneumonia) ?  COPD mixed type (  HCC) ?  Chronic diastolic CHF (congestive heart failure) (HCC) ?  Paroxysmal atrial fibrillation (HCC) ? ? ?Acute on chronic respiratory  failure hypoxia plan is going to be to continue with the ventilator support respiratory therapy will check the R SBI and mechanics try to wean if patient is able to tolerate ?Community-acquired pneumonia

## 2021-06-26 ENCOUNTER — Other Ambulatory Visit (HOSPITAL_COMMUNITY): Payer: Medicare PPO

## 2021-06-26 DIAGNOSIS — I5032 Chronic diastolic (congestive) heart failure: Secondary | ICD-10-CM | POA: Diagnosis not present

## 2021-06-26 DIAGNOSIS — J449 Chronic obstructive pulmonary disease, unspecified: Secondary | ICD-10-CM | POA: Diagnosis not present

## 2021-06-26 DIAGNOSIS — J189 Pneumonia, unspecified organism: Secondary | ICD-10-CM | POA: Diagnosis not present

## 2021-06-26 DIAGNOSIS — J9621 Acute and chronic respiratory failure with hypoxia: Secondary | ICD-10-CM | POA: Diagnosis not present

## 2021-06-26 LAB — CBC
HCT: 26.2 % — ABNORMAL LOW (ref 36.0–46.0)
Hemoglobin: 8.1 g/dL — ABNORMAL LOW (ref 12.0–15.0)
MCH: 30.3 pg (ref 26.0–34.0)
MCHC: 30.9 g/dL (ref 30.0–36.0)
MCV: 98.1 fL (ref 80.0–100.0)
Platelets: 252 10*3/uL (ref 150–400)
RBC: 2.67 MIL/uL — ABNORMAL LOW (ref 3.87–5.11)
RDW: 16.2 % — ABNORMAL HIGH (ref 11.5–15.5)
WBC: 13.9 10*3/uL — ABNORMAL HIGH (ref 4.0–10.5)
nRBC: 0 % (ref 0.0–0.2)

## 2021-06-26 LAB — BASIC METABOLIC PANEL
Anion gap: 3 — ABNORMAL LOW (ref 5–15)
BUN: 49 mg/dL — ABNORMAL HIGH (ref 8–23)
CO2: 25 mmol/L (ref 22–32)
Calcium: 9.3 mg/dL (ref 8.9–10.3)
Chloride: 110 mmol/L (ref 98–111)
Creatinine, Ser: 0.46 mg/dL (ref 0.44–1.00)
GFR, Estimated: >60 mL/min (ref >60)
Glucose, Bld: 123 mg/dL — ABNORMAL HIGH (ref 70–99)
Potassium: 4.6 mmol/L (ref 3.5–5.1)
Sodium: 138 mmol/L (ref 135–145)

## 2021-06-26 LAB — MAGNESIUM: Magnesium: 1.8 mg/dL (ref 1.7–2.4)

## 2021-06-26 NOTE — Progress Notes (Signed)
? ? ?Referring Physician(s): ?Luna Kitchens ? ?Supervising Physician: Roanna Banning ? ?Patient Status:  [facility] - In-pt ? ?Chief Complaint: ? ?F/u chest tube ? ?Brief History: ? ?Ms Shaunee is a 71 yo female who is s/p attempted right thoracentesis on 4/25. ? ?Post thora CXR showed right PTX ? ?She underwent 10 Fr right chest tube placement by Dr. Milford Cage.  ? ?Dr. Park Breed from pulmonary felt the chest tube may need to be repositioned.  ?  ?Discussed with Dr. Lowella Dandy who recommend CT chest for further evaluation.   ?CT chest reviewed by Dr. Lowella Dandy. ? ?She underwent exchange and upsize of right chest tube for better control of the right PTX by Dr. Archer Asa yesterday. ? ?CXR Today showed= ? ?Stable to decreased right pneumothorax. Right lung aeration is ?slightly better ? 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  ? ?Unclear if the right lung would be able to fully re-expand ? ?Patient may benefit from cardiothoracic surgery consult when possible.  ? ?Subjective: ? ?Lying in bed. Awake. Nods appropriately to my questions. ? ?Allergies: ?Amlodipine ? ?Medications: ?Prior to Admission medications   ?Not on File  ? ? ? ?Vital Signs: ?BP 135/77 (BP Location: Left Leg)   Pulse 80   Resp (!) 23  ? ?Physical Exam ?Cardiovascular:  ?   Rate and Rhythm: Normal rate.  ?Pulmonary:  ?   Comments: Vent/Trach ?Neurological:  ?   Mental Status: She is alert.  ?Chest tube in place on right. ?No air leak. ? ?Imaging: ?DG Chest 1 View ? ?Result Date: 06/26/2021 ?CLINICAL DATA:  Pneumothorax EXAM: CHEST  1 VIEW COMPARISON:  06/24/2021 FINDINGS: Patient is rotated. Increased caliber of chest tube with repositioning more superiorly. Tracheostomy device is again noted. Pneumothorax is stable to decreased. Pleural effusions are more apparent on prior chest CT. Persistent opacities of the right much greater than left lungs. Right lung aeration is slightly better. Similar cardiomediastinal contours. IMPRESSION: Stable  to decreased right pneumothorax. Right lung aeration is slightly better. Electronically Signed   By: Guadlupe Spanish M.D.   On: 06/26/2021 12:10  ? ?DG Chest 1 View ? ?Addendum Date: 06/22/2021   ?ADDENDUM REPORT: 06/22/2021 16:37 ADDENDUM: On further review, there is a moderate superior right pneumothorax following interval attempted right thoracentesis. This measures approximately 2.8 cm in craniocaudal dimension. This was discussed with Dr. Roanna Banning of Interventional Radiology at 16:20 hours on 06/22/2021. He reports the patient is coming down to the interventional radiology suite for a chest tube placement. Electronically Signed   By: Neita Garnet M.D.   On: 06/22/2021 16:37  ? ?Result Date: 06/22/2021 ?CLINICAL DATA:  After attempted right-sided thoracentesis. EXAM: CHEST  1 VIEW COMPARISON:  AP chest 06/01/2021, CT chest 06/20/2021 FINDINGS: Tracheostomy tube overlies the midline trachea. Cardiac silhouette and mediastinal contours are within normal limits. There is dense heterogeneous airspace opacification within the right mid lung with more diffuse scattered left lung and right upper and lower lung airspace opacities, similar to prior radiograph and CT. Likely small bilateral pleural effusions. No pneumothorax is seen. No acute skeletal abnormality. Left chest wall cardiac pacer is again seen with leads overlying the right atrium and right ventricle. IMPRESSION: Dense airspace consolidation within the right mid lung corresponding to the dense posterior right upper lobe middle lobe and lower lobe pneumonia with cavitary components seen on prior CT. Additional patchy airspace opacities throughout the left lung. Findings are again consistent with multifocal pneumonia including necrotizing components. Tracheostomy tube  in appropriate position. Electronically Signed: By: Neita Garnetonald  Viola M.D. On: 06/22/2021 15:58  ? ?CT CHEST WO CONTRAST ? ?Result Date: 06/24/2021 ?CLINICAL DATA:  Evaluate right chest tube position  EXAM: CT CHEST WITHOUT CONTRAST TECHNIQUE: Multidetector CT imaging of the chest was performed following the standard protocol without IV contrast. RADIATION DOSE REDUCTION: This exam was performed according to the departmental dose-optimization program which includes automated exposure control, adjustment of the mA and/or kV according to patient size and/or use of iterative reconstruction technique. COMPARISON:  06/20/2021 FINDINGS: Cardiovascular: Tracheostomy cannula in appropriate position. Dual lead left chest wall pacemaker is noted. Gastrostomy tube is appropriately position. Diffuse body wall edema is present. Heart size is within normal limits. Minimal pericardial effusion is present. Coronary artery calcifications are noted. Main pulmonary artery is dilated measuring up to 3.8 cm suggestive of pulmonary artery hypertension. Mediastinum/Nodes: Multiple mildly enlarged mediastinal lymph nodes, unchanged from prior examination. No enlarged axillary lymph nodes. Lungs/Pleura: Significant interval reduction of right pleural effusion. Right chest tube is appropriately position terminating in the posterior pleural space. Diffuse airspace opacity of the right lower lobe is not significantly changed from prior examination. Minimal right pneumothorax is now present, likely due to trapped lung. Scattered ground-glass opacities seen in the left lung consistent with additional sites of infection have improved since prior study. Upper Abdomen: Minimal perisplenic fluid is present. Cholecystectomy changes are seen. Musculoskeletal: Multiple bilateral rib deformities consistent with remote fractures. IMPRESSION: 1. Right chest tube is appropriately position within the posterior right pleural space. 2. Minimal right pneumothorax is likely related to trapped lung. 3. Dense airspace opacity of the right lower lobe is unchanged from prior examination and likely secondary to pneumonia. Thick-walled cavitary structure in the  right upper lobe is unchanged from prior examination. 4. Interval improvement of multifocal airspace opacity in the left lung likely due to resolving pneumonia. 5. Interval development of minimal perisplenic ascites. Electronically Signed   By: Acquanetta BellingFarhaan  Mir M.D.   On: 06/24/2021 14:06  ? ?IR Catheter Tube Change ? ?Result Date: 06/25/2021 ?INDICATION: 71 year old female with pneumonia, empyema, extensive lung changes and non improving pneumothorax. A 10 French pigtail thoracostomy tube was placed on 06/22/2021. Due to persistent pneumothorax, patient presents for tube upsize and repositioning. EXAM: Chest tube exchange and repositioning. MEDICATIONS: None. ANESTHESIA/SEDATION: None. COMPLICATIONS: None immediate. PROCEDURE: Informed written consent was obtained from the patient after a thorough discussion of the procedural risks, benefits and alternatives. All questions were addressed. Maximal Sterile Barrier Technique was utilized including caps, mask, sterile gowns, sterile gloves, sterile drape, hand hygiene and skin antiseptic. A timeout was performed prior to the initiation of the procedure. The existing pigtail catheter was freed from the retention suture, transected and removed over a wire. A 5 French catheter was introduced into the thoracic cavity over the wire and the wire was navigated up into the right lung apex. A 14 French cook all-purpose drainage catheter was modified with additional sideholes and then advanced over the wire and into the right lung apex. When the catheter was formed, it fall back down into the region of the mid lung. However, the catheter is hubbed and cannot be advanced any further. There was return of copious pleural fluid. The catheter was secured to the skin with 0 silk suture. Bandages were applied. IMPRESSION: Successful exchange for a larger 14 French thoracostomy tube which was advanced further into the thorax directed toward the lung apex. Electronically Signed   By: Isac CaddyHeath   McCullough M.D.  On: 06/25/2021 15:04  ? ?DG Chest Port 1 View ? ?Result Date: 06/24/2021 ?CLINICAL DATA:  71 year old female with history of chest tube placement. EXAM: PORTABLE CHEST 1 VIEW COMPARISON:

## 2021-06-26 NOTE — Progress Notes (Signed)
Pulmonary Critical Care Medicine ?[facility] ?  ?PULMONARY CRITICAL CARE SERVICE ? ?PROGRESS NOTE ? ? ? ? ?Sherry Mcmahon  ?[id number]  ?DOB: 1950/07/13  ? ?DOA: 06/24/2021 ? ?Referring Physician: Satira Sark, MD ? ?HPI: Sherry Mcmahon is a 71 y.o. female being followed for ventilator/airway/oxygen weaning Acute on Chronic Respiratory Failure.  Patient was attempted on pressure support however she failed back on the full vent support and assist control ? ?Medications: ?Reviewed on Rounds ? ?Physical Exam: ? ?Vitals: Temperature is 97.1 pulse of 80 respiratory 15 blood pressure 130/64 saturations 100% ? ?Ventilator Settings assist-control FiO2 is 28% tidal volume 400 PEEP of 5 ? ?General: Comfortable at this time ?Neck: supple ?Cardiovascular: no malignant arrhythmias ?Respiratory: Scattered coarse rhonchi ?Skin: no rash seen on limited exam ?Musculoskeletal: No gross abnormality ?Psychiatric:unable to assess ?Neurologic:no involuntary movements   ?   ?   ?Lab Data:  ? ?Basic Metabolic Panel: ?Recent Labs  ?Lab 06/21/21 ?0912 06/24/21 ?0435 06/26/21 ?0344  ?NA 138 139 138  ?K 4.3 4.7 4.6  ?CL 109 112* 110  ?CO2 21* 24 25  ?GLUCOSE 114* 104* 123*  ?BUN 49* 52* 49*  ?CREATININE 0.61 0.54 0.46  ?CALCIUM 9.4 9.5 9.3  ?MG  --  1.9 1.8  ? ? ?ABG: ?Recent Labs  ?Lab 06/19/21 ?1405 06/24/21 ?1012  ?PHART 7.31* 7.36  ?PCO2ART 46 45  ?PO2ART 85 80*  ?HCO3 23.2 25.4  ?O2SAT 99 99.1  ? ? ?Liver Function Tests: ?Recent Labs  ?Lab 06/21/21 ?0912  ?AST 15  ?ALT 16  ?ALKPHOS 78  ?BILITOT 0.3  ?PROT 5.6*  ?ALBUMIN 1.5*  ? ?No results for input(s): LIPASE, AMYLASE in the last 168 hours. ?No results for input(s): AMMONIA in the last 168 hours. ? ?CBC: ?Recent Labs  ?Lab 06/21/21 ?0912 06/24/21 ?0435 06/26/21 ?0344  ?WBC 18.3* 16.1* 13.9*  ?HGB 9.2* 8.9* 8.1*  ?HCT 30.2* 29.8* 26.2*  ?MCV 97.7 100.0 98.1  ?PLT 381 289 252  ? ? ?Cardiac Enzymes: ?No results for input(s): CKTOTAL, CKMB, CKMBINDEX, TROPONINI in the last  168 hours. ? ?BNP (last 3 results) ?No results for input(s): BNP in the last 8760 hours. ? ?ProBNP (last 3 results) ?No results for input(s): PROBNP in the last 8760 hours. ? ?Radiological Exams: ?CT CHEST WO CONTRAST ? ?Result Date: 06/24/2021 ?CLINICAL DATA:  Evaluate right chest tube position EXAM: CT CHEST WITHOUT CONTRAST TECHNIQUE: Multidetector CT imaging of the chest was performed following the standard protocol without IV contrast. RADIATION DOSE REDUCTION: This exam was performed according to the departmental dose-optimization program which includes automated exposure control, adjustment of the mA and/or kV according to patient size and/or use of iterative reconstruction technique. COMPARISON:  06/20/2021 FINDINGS: Cardiovascular: Tracheostomy cannula in appropriate position. Dual lead left chest wall pacemaker is noted. Gastrostomy tube is appropriately position. Diffuse body wall edema is present. Heart size is within normal limits. Minimal pericardial effusion is present. Coronary artery calcifications are noted. Main pulmonary artery is dilated measuring up to 3.8 cm suggestive of pulmonary artery hypertension. Mediastinum/Nodes: Multiple mildly enlarged mediastinal lymph nodes, unchanged from prior examination. No enlarged axillary lymph nodes. Lungs/Pleura: Significant interval reduction of right pleural effusion. Right chest tube is appropriately position terminating in the posterior pleural space. Diffuse airspace opacity of the right lower lobe is not significantly changed from prior examination. Minimal right pneumothorax is now present, likely due to trapped lung. Scattered ground-glass opacities seen in the left lung consistent with additional sites of infection  have improved since prior study. Upper Abdomen: Minimal perisplenic fluid is present. Cholecystectomy changes are seen. Musculoskeletal: Multiple bilateral rib deformities consistent with remote fractures. IMPRESSION: 1. Right chest tube  is appropriately position within the posterior right pleural space. 2. Minimal right pneumothorax is likely related to trapped lung. 3. Dense airspace opacity of the right lower lobe is unchanged from prior examination and likely secondary to pneumonia. Thick-walled cavitary structure in the right upper lobe is unchanged from prior examination. 4. Interval improvement of multifocal airspace opacity in the left lung likely due to resolving pneumonia. 5. Interval development of minimal perisplenic ascites. Electronically Signed   By: Miachel Roux M.D.   On: 06/24/2021 14:06  ? ?IR Catheter Tube Change ? ?Result Date: 06/25/2021 ?INDICATION: 71 year old female with pneumonia, empyema, extensive lung changes and non improving pneumothorax. A 10 French pigtail thoracostomy tube was placed on 06/22/2021. Due to persistent pneumothorax, patient presents for tube upsize and repositioning. EXAM: Chest tube exchange and repositioning. MEDICATIONS: None. ANESTHESIA/SEDATION: None. COMPLICATIONS: None immediate. PROCEDURE: Informed written consent was obtained from the patient after a thorough discussion of the procedural risks, benefits and alternatives. All questions were addressed. Maximal Sterile Barrier Technique was utilized including caps, mask, sterile gowns, sterile gloves, sterile drape, hand hygiene and skin antiseptic. A timeout was performed prior to the initiation of the procedure. The existing pigtail catheter was freed from the retention suture, transected and removed over a wire. A 5 French catheter was introduced into the thoracic cavity over the wire and the wire was navigated up into the right lung apex. A 14 French cook all-purpose drainage catheter was modified with additional sideholes and then advanced over the wire and into the right lung apex. When the catheter was formed, it fall back down into the region of the mid lung. However, the catheter is hubbed and cannot be advanced any further. There was  return of copious pleural fluid. The catheter was secured to the skin with 0 silk suture. Bandages were applied. IMPRESSION: Successful exchange for a larger 14 French thoracostomy tube which was advanced further into the thorax directed toward the lung apex. Electronically Signed   By: Jacqulynn Cadet M.D.   On: 06/25/2021 15:04   ? ?Assessment/Plan ?Active Problems: ?  Acute on chronic respiratory failure with hypoxia (HCC) ?  CAP (community acquired pneumonia) ?  COPD mixed type ([place]) ?  Chronic diastolic CHF (congestive heart failure) ([place]) ?  Paroxysmal atrial fibrillation (HCC) ? ? ?Acute on chronic respiratory failure hypoxia we will continue with assist control mode 28% FiO2 patient is still on a PEEP of 5.  ?Community-acquired pneumonia has been treated ?COPD supportive care prognosis is guarded ?Chronic diastolic heart failure compensated ?Paroxysmal atrial fibrillation rate is controlled ? ? ?I have personally seen and evaluated the patient, evaluated laboratory and imaging results, formulated the assessment and plan and placed orders. ?The Patient requires high complexity decision making with multiple systems involvement.  ?Rounds were done with the Respiratory Therapy Director and Staff therapists and discussed with nursing staff also. ? ?Allyne Gee, MD FCCP ?Pulmonary Critical Care Medicine ?Sleep Medicine ? ?

## 2021-06-27 DIAGNOSIS — J449 Chronic obstructive pulmonary disease, unspecified: Secondary | ICD-10-CM | POA: Diagnosis not present

## 2021-06-27 DIAGNOSIS — J9621 Acute and chronic respiratory failure with hypoxia: Secondary | ICD-10-CM | POA: Diagnosis not present

## 2021-06-27 DIAGNOSIS — J189 Pneumonia, unspecified organism: Secondary | ICD-10-CM | POA: Diagnosis not present

## 2021-06-27 DIAGNOSIS — I5032 Chronic diastolic (congestive) heart failure: Secondary | ICD-10-CM | POA: Diagnosis not present

## 2021-06-27 NOTE — Progress Notes (Signed)
Pulmonary Critical Care Medicine ?[facility] ?  ?PULMONARY CRITICAL CARE SERVICE ? ?PROGRESS NOTE ? ? ? ? ?Sherry Mcmahon  ?VEH:[id number]  ?DOB: October 19, 1950  ? ?DOA: Jun 29, 2021 ? ?Referring Physician: Luna Kitchens, MD ? ?HPI: Sherry Mcmahon is a 71 y.o. female being followed for ventilator/airway/oxygen weaning Acute on Chronic Respiratory Failure.  Patient currently is on assist control mode has been on 28% FiO2 patient was able to do 4 hours of pressure support yesterday ? ?Medications: ?Reviewed on Rounds ? ?Physical Exam: ? ?Vitals: Temperature is 97.0 pulse of 80 respiratory 19 blood pressure 124/76 saturations 97% ? ?Ventilator Settings assist control FiO2 28% tidal volume 414 PEEP 5 ? ?General: Comfortable at this time ?Neck: supple ?Cardiovascular: no malignant arrhythmias ?Respiratory: Scattered rhonchi expansion is equal ?Skin: no rash seen on limited exam ?Musculoskeletal: No gross abnormality ?Psychiatric:unable to assess ?Neurologic:no involuntary movements   ?   ?   ?Lab Data:  ? ?Basic Metabolic Panel: ?Recent Labs  ?Lab 06/21/21 ?0912 06/24/21 ?0435 06/26/21 ?0344  ?NA 138 139 138  ?K 4.3 4.7 4.6  ?CL 109 112* 110  ?CO2 21* 24 25  ?GLUCOSE 114* 104* 123*  ?BUN 49* 52* 49*  ?CREATININE 0.61 0.54 0.46  ?CALCIUM 9.4 9.5 9.3  ?MG  --  1.9 1.8  ? ? ?ABG: ?Recent Labs  ?Lab 06/24/21 ?1012  ?PHART 7.36  ?PCO2ART 45  ?PO2ART 80*  ?HCO3 25.4  ?O2SAT 99.1  ? ? ?Liver Function Tests: ?Recent Labs  ?Lab 06/21/21 ?0912  ?AST 15  ?ALT 16  ?ALKPHOS 78  ?BILITOT 0.3  ?PROT 5.6*  ?ALBUMIN 1.5*  ? ?No results for input(s): LIPASE, AMYLASE in the last 168 hours. ?No results for input(s): AMMONIA in the last 168 hours. ? ?CBC: ?Recent Labs  ?Lab 06/21/21 ?0912 06/24/21 ?0435 06/26/21 ?0344  ?WBC 18.3* 16.1* 13.9*  ?HGB 9.2* 8.9* 8.1*  ?HCT 30.2* 29.8* 26.2*  ?MCV 97.7 100.0 98.1  ?PLT 381 289 252  ? ? ?Cardiac Enzymes: ?No results for input(s): CKTOTAL, CKMB, CKMBINDEX, TROPONINI in the last 168  hours. ? ?BNP (last 3 results) ?No results for input(s): BNP in the last 8760 hours. ? ?ProBNP (last 3 results) ?No results for input(s): PROBNP in the last 8760 hours. ? ?Radiological Exams: ?DG Chest 1 View ? ?Result Date: 06/26/2021 ?CLINICAL DATA:  Pneumothorax EXAM: CHEST  1 VIEW COMPARISON:  06/24/2021 FINDINGS: Patient is rotated. Increased caliber of chest tube with repositioning more superiorly. Tracheostomy device is again noted. Pneumothorax is stable to decreased. Pleural effusions are more apparent on prior chest CT. Persistent opacities of the right much greater than left lungs. Right lung aeration is slightly better. Similar cardiomediastinal contours. IMPRESSION: Stable to decreased right pneumothorax. Right lung aeration is slightly better. Electronically Signed   By: Guadlupe Spanish M.D.   On: 06/26/2021 12:10  ? ?IR Catheter Tube Change ? ?Result Date: 06/25/2021 ?INDICATION: 71 year old female with pneumonia, empyema, extensive lung changes and non improving pneumothorax. A 10 French pigtail thoracostomy tube was placed on 06/22/2021. Due to persistent pneumothorax, patient presents for tube upsize and repositioning. EXAM: Chest tube exchange and repositioning. MEDICATIONS: None. ANESTHESIA/SEDATION: None. COMPLICATIONS: None immediate. PROCEDURE: Informed written consent was obtained from the patient after a thorough discussion of the procedural risks, benefits and alternatives. All questions were addressed. Maximal Sterile Barrier Technique was utilized including caps, mask, sterile gowns, sterile gloves, sterile drape, hand hygiene and skin antiseptic. A timeout was performed prior to the initiation of the  procedure. The existing pigtail catheter was freed from the retention suture, transected and removed over a wire. A 5 French catheter was introduced into the thoracic cavity over the wire and the wire was navigated up into the right lung apex. A 14 French cook all-purpose drainage catheter was  modified with additional sideholes and then advanced over the wire and into the right lung apex. When the catheter was formed, it fall back down into the region of the mid lung. However, the catheter is hubbed and cannot be advanced any further. There was return of copious pleural fluid. The catheter was secured to the skin with 0 silk suture. Bandages were applied. IMPRESSION: Successful exchange for a larger 14 French thoracostomy tube which was advanced further into the thorax directed toward the lung apex. Electronically Signed   By: Malachy Moan M.D.   On: 06/25/2021 15:04   ? ?Assessment/Plan ?Active Problems: ?  Acute on chronic respiratory failure with hypoxia (HCC) ?  CAP (community acquired pneumonia) ?  COPD mixed type (HCC) ?  Chronic diastolic CHF (congestive heart failure) (HCC) ?  Paroxysmal atrial fibrillation (HCC) ? ? ?Acute on chronic respiratory failure hypoxia will wean on pressure support once again today advance as tolerated. ?Community-acquired pneumonia has been treated with antibiotics ?Pneumothorax chest tube has been adjusted radiology note reviewed ?COPD medical management inhalers as tolerated ?Paroxysmal atrial fibrillation rate is controlled ?Chronic diastolic heart failure appears to be compensated ? ? ?I have personally seen and evaluated the patient, evaluated laboratory and imaging results, formulated the assessment and plan and placed orders. ?The Patient requires high complexity decision making with multiple systems involvement.  ?Rounds were done with the Respiratory Therapy Director and Staff therapists and discussed with nursing staff also. ? ?Yevonne Pax, MD FCCP ?Pulmonary Critical Care Medicine ?Sleep Medicine ? ?

## 2021-06-28 DIAGNOSIS — J189 Pneumonia, unspecified organism: Secondary | ICD-10-CM | POA: Diagnosis not present

## 2021-06-28 DIAGNOSIS — J449 Chronic obstructive pulmonary disease, unspecified: Secondary | ICD-10-CM | POA: Diagnosis not present

## 2021-06-28 DIAGNOSIS — I5032 Chronic diastolic (congestive) heart failure: Secondary | ICD-10-CM | POA: Diagnosis not present

## 2021-06-28 DIAGNOSIS — J9621 Acute and chronic respiratory failure with hypoxia: Secondary | ICD-10-CM | POA: Diagnosis not present

## 2021-06-28 NOTE — Progress Notes (Addendum)
? ? ?Referring Physician(s[facility] ? ?Supervising Physician: Gilmer MorWagner, Jaime ? ?Patient [facility]  [facility] ? ?Chief Complaint: ? ?Right PTX after attempted thoracentesis on 4/25  ?S/p 10 Fr right chest tube placement on 4/25, upsize to 14 Fr on 4/28  ? ?Subjective: ? ?Patient laying in bed, NAD.  ?On vent via trach, states that she needs to get up. ?Shakes head when asked if she has shortness of breath or any other complaints.  ? ?Allergies: ?Amlodipine ? ?Medications: ?Prior to Admission medications   ?Not on File  ? ? ? ?Vital Signs: ?BP 135/77 (BP Location: Left Leg)   Pulse 80   Resp (!) 23  ? ?Physical Exam ?Vitals reviewed.  ?HENT:  ?   Head: Normocephalic.  ?Pulmonary:  ?   Comments: On vent via trach ?Abdominal:  ?   General: Abdomen is flat.  ?Musculoskeletal:  ?   Cervical back: Neck supple.  ?Skin: ?   General: Skin is warm and dry.  ?   Comments: Positive right chest tube to Pleur evac. Site is unremarkable with no erythema, edema, tenderness, bleeding or drainage. Suture and stat lock in place. Dressing is clean, dry, and intact. ~2L of  hazy yellow colored fluid noted in the Pleur evac . No air leak   ?Neurological:  ?   Mental Status: She is alert. Mental status is at baseline.  ?Psychiatric:     ?   Behavior: Behavior normal.  ? ? ?Imaging: ?DG Chest 1 View ? ?Result Date: 06/26/2021 ?CLINICAL DATA:  Pneumothorax EXAM: CHEST  1 VIEW COMPARISON:  06/24/2021 FINDINGS: Patient is rotated. Increased caliber of chest tube with repositioning more superiorly. Tracheostomy device is again noted. Pneumothorax is stable to decreased. Pleural effusions are more apparent on prior chest CT. Persistent opacities of the right much greater than left lungs. Right lung aeration is slightly better. Similar cardiomediastinal contours. IMPRESSION: Stable to decreased right pneumothorax. Right lung aeration is slightly better. Electronically Signed   By: Guadlupe SpanishPraneil  Patel M.D.   On: 06/26/2021 12:10  ? ?CT CHEST WO  CONTRAST ? ?Result Date: 06/24/2021 ?CLINICAL DATA:  Evaluate right chest tube position EXAM: CT CHEST WITHOUT CONTRAST TECHNIQUE: Multidetector CT imaging of the chest was performed following the standard protocol without IV contrast. RADIATION DOSE REDUCTION: This exam was performed according to the departmental dose-optimization program which includes automated exposure control, adjustment of the mA and/or kV according to patient size and/or use of iterative reconstruction technique. COMPARISON:  06/20/2021 FINDINGS: Cardiovascular: Tracheostomy cannula in appropriate position. Dual lead left chest wall pacemaker is noted. Gastrostomy tube is appropriately position. Diffuse body wall edema is present. Heart size is within normal limits. Minimal pericardial effusion is present. Coronary artery calcifications are noted. Main pulmonary artery is dilated measuring up to 3.8 cm suggestive of pulmonary artery hypertension. Mediastinum/Nodes: Multiple mildly enlarged mediastinal lymph nodes, unchanged from prior examination. No enlarged axillary lymph nodes. Lungs/Pleura: Significant interval reduction of right pleural effusion. Right chest tube is appropriately position terminating in the posterior pleural space. Diffuse airspace opacity of the right lower lobe is not significantly changed from prior examination. Minimal right pneumothorax is now present, likely due to trapped lung. Scattered ground-glass opacities seen in the left lung consistent with additional sites of infection have improved since prior study. Upper Abdomen: Minimal perisplenic fluid is present. Cholecystectomy changes are seen. Musculoskeletal: Multiple bilateral rib deformities consistent with remote fractures. IMPRESSION: 1. Right chest tube is appropriately position within the posterior right  pleural space. 2. Minimal right pneumothorax is likely related to trapped lung. 3. Dense airspace opacity of the right lower lobe is unchanged from prior  examination and likely secondary to pneumonia. Thick-walled cavitary structure in the right upper lobe is unchanged from prior examination. 4. Interval improvement of multifocal airspace opacity in the left lung likely due to resolving pneumonia. 5. Interval development of minimal perisplenic ascites. Electronically Signed   By: Acquanetta Belling M.D.   On: 06/24/2021 14:06  ? ?IR Catheter Tube Change ? ?Result Date: 06/25/2021 ?INDICATION: 71 year old female with pneumonia, empyema, extensive lung changes and non improving pneumothorax. A 10 French pigtail thoracostomy tube was placed on 06/22/2021. Due to persistent pneumothorax, patient presents for tube upsize and repositioning. EXAM: Chest tube exchange and repositioning. MEDICATIONS: None. ANESTHESIA/SEDATION: None. COMPLICATIONS: None immediate. PROCEDURE: Informed written consent was obtained from the patient after a thorough discussion of the procedural risks, benefits and alternatives. All questions were addressed. Maximal Sterile Barrier Technique was utilized including caps, mask, sterile gowns, sterile gloves, sterile drape, hand hygiene and skin antiseptic. A timeout was performed prior to the initiation of the procedure. The existing pigtail catheter was freed from the retention suture, transected and removed over a wire. A 5 French catheter was introduced into the thoracic cavity over the wire and the wire was navigated up into the right lung apex. A 14 French cook all-purpose drainage catheter was modified with additional sideholes and then advanced over the wire and into the right lung apex. When the catheter was formed, it fall back down into the region of the mid lung. However, the catheter is hubbed and cannot be advanced any further. There was return of copious pleural fluid. The catheter was secured to the skin with 0 silk suture. Bandages were applied. IMPRESSION: Successful exchange for a larger 14 French thoracostomy tube which was advanced  further into the thorax directed toward the lung apex. Electronically Signed   By: Malachy Moan M.D.   On: 06/25/2021 15:04   ? ?Labs: ? ?CBC: ?Recent Labs  ?  06/17/2021 ?0446 06/21/21 ?0912 06/24/21 ?0435 06/26/21 ?0344  ?WBC 29.9* 18.3* 16.1* 13.9*  ?HGB 9.4* 9.2* 8.9* 8.1*  ?HCT 29.0* 30.2* 29.8* 26.2*  ?PLT 430* 381 289 252  ? ? ?COAGS: ?Recent Labs  ?  06/20/2021 ?0446  ?INR 1.4*  ? ? ?BMP: ?Recent Labs  ?  06/21/2021 ?0446 06/21/21 ?0912 06/24/21 ?0435 06/26/21 ?0344  ?NA 134* 138 139 138  ?K 3.6 4.3 4.7 4.6  ?CL 105 109 112* 110  ?CO2 21* 21* 24 25  ?GLUCOSE 128* 114* 104* 123*  ?BUN 34* 49* 52* 49*  ?CALCIUM 9.0 9.4 9.5 9.3  ?CREATININE 0.65 0.61 0.54 0.46  ?GFRNONAA >60 >60 >60 >60  ? ? ?LIVER FUNCTION TESTS: ?Recent Labs  ?  06/02/2021 ?0446 06/21/21 ?0912  ?BILITOT 0.6 0.3  ?AST 16 15  ?ALT 17 16  ?ALKPHOS 87 78  ?PROT 5.5* 5.6*  ?ALBUMIN 1.7* 1.5*  ? ? ?Assessment and Plan: ? ?71 y.o. female with hx COPD and acute on chronic respiratory failure, CAP on abx, who developed right PTX after attempted thoracentesis on 4/25.  ? ?IR interventions: ?- 4/25: attempted right thoracentesis  ?- 4/25: right 10 Fr chest tube placement by Dr. Milford Cage ?- 4/27: CT chest showed persistent PTX ?- 4/28: right chest tube exchanged and upsized to 14 Fr by Dr. Archer Asa ? ?CXR yesterday showed stable to decreased right pneumothorax.  ?OP still over 10 mL/ day  ?  No air leak on today's exam  ? ?Of note, Dr. Lowella Dandy stated that with patient's extensive pulmonary disease seen on CT chest, the right lung may never fully re-expand.  ? ?PLAN ?- continue right chest tube to suction  ?- repeat CXR daily, order for tomorrow 5/2 placed  ?- continue to measure output, when op less than 10 mL a day, will place the chest tube on water seal x 24 hours and obtain CXR before removal  ? ?IR to follow.  ? ? ?Electronically Signed: ?Willette Brace, PA-C ?06/28/2021, 9:42 AM ? ? ?I spent a total of 15 Minutes at the the patient's bedside AND on the  patient's hospital floor or unit, greater than 50% of which was counseling/coordinating care for right chest tube. ? ?This chart was dictated using voice recognition software.  Despite best efforts to proofread,  e

## 2021-06-28 NOTE — Progress Notes (Signed)
Pulmonary Critical Care Medicine ?[facility] [place] ?  ?PULMONARY CRITICAL CARE SERVICE ? ?PROGRESS NOTE ? ? ? ? ?Sherry Mcmahon  ?JME:[id number]  ?DOB: 07-Jan-1951  ? ?DOA: 06/19/2021 ? ?Referring Physician: Luna Kitchens, MD ? ?HPI: HELAINE YACKEL is a 71 y.o. female being followed for ventilator/airway/oxygen weaning Acute on Chronic Respiratory Failure.  Patient is on pressure support wean goal is for 12 hours ? ?Medications: ?Reviewed on Rounds ? ?Physical Exam: ? ?Vitals: Temperature is 97.0 pulse of 80 respiratory 28 blood pressure 91/67 saturations 97% ? ?Ventilator Settings pressure support FiO2 28% pressure 12/5 ? ?General: Comfortable at this time ?Neck: supple ?Cardiovascular: no malignant arrhythmias ?Respiratory: Coarse rhonchi expansion is equal ?Skin: no rash seen on limited exam ?Musculoskeletal: No gross abnormality ?Psychiatric:unable to assess ?Neurologic:no involuntary movements   ?   ?   ?Lab Data:  ? ?Basic Metabolic Panel: ?Recent Labs  ?Lab 06/24/21 ?0435 06/26/21 ?0344  ?NA 139 138  ?K 4.7 4.6  ?CL 112* 110  ?CO2 24 25  ?GLUCOSE 104* 123*  ?BUN 52* 49*  ?CREATININE 0.54 0.46  ?CALCIUM 9.5 9.3  ?MG 1.9 1.8  ? ? ?ABG: ?Recent Labs  ?Lab 06/24/21 ?1012  ?PHART 7.36  ?PCO2ART 45  ?PO2ART 80*  ?HCO3 25.4  ?O2SAT 99.1  ? ? ?Liver Function Tests: ?No results for input(s): AST, ALT, ALKPHOS, BILITOT, PROT, ALBUMIN in the last 168 hours. ?No results for input(s): LIPASE, AMYLASE in the last 168 hours. ?No results for input(s): AMMONIA in the last 168 hours. ? ?CBC: ?Recent Labs  ?Lab 06/24/21 ?0435 06/26/21 ?0344  ?WBC 16.1* 13.9*  ?HGB 8.9* 8.1*  ?HCT 29.8* 26.2*  ?MCV 100.0 98.1  ?PLT 289 252  ? ? ?Cardiac Enzymes: ?No results for input(s): CKTOTAL, CKMB, CKMBINDEX, TROPONINI in the last 168 hours. ? ?BNP (last 3 results) ?No results for input(s): BNP in the last 8760 hours. ? ?ProBNP (last 3 results) ?No results for input(s): PROBNP in the last 8760 hours. ? ?Radiological Exams: ?No results  found. ? ?Assessment/Plan ?Active Problems: ?  Acute on chronic respiratory failure with hypoxia (HCC) ?  CAP (community acquired pneumonia) ?  COPD mixed type (HCC) ?  Chronic diastolic CHF (congestive heart failure) (HCC) ?  Paroxysmal atrial fibrillation (HCC) ? ? ?Acute on chronic respiratory failure hypoxia plan is to wean for 12 hours on pressure support ?Community-acquired pneumonia no change we will continue to follow along closely. ?COPD medical management ?Chronic diastolic heart failure compensated ?Paroxysmal atrial fibrillation rate is controlled ? ? ?I have personally seen and evaluated the patient, evaluated laboratory and imaging results, formulated the assessment and plan and placed orders. ?The Patient requires high complexity decision making with multiple systems involvement.  ?Rounds were done with the Respiratory Therapy Director and Staff therapists and discussed with nursing staff also. ? ?Yevonne Pax, MD FCCP ?Pulmonary Critical Care Medicine ?Sleep Medicine ? ?

## 2021-06-28 DEATH — deceased

## 2021-06-29 ENCOUNTER — Other Ambulatory Visit (HOSPITAL_COMMUNITY): Payer: Medicare PPO

## 2021-06-29 DIAGNOSIS — J449 Chronic obstructive pulmonary disease, unspecified: Secondary | ICD-10-CM | POA: Diagnosis not present

## 2021-06-29 DIAGNOSIS — I5032 Chronic diastolic (congestive) heart failure: Secondary | ICD-10-CM | POA: Diagnosis not present

## 2021-06-29 DIAGNOSIS — J189 Pneumonia, unspecified organism: Secondary | ICD-10-CM | POA: Diagnosis not present

## 2021-06-29 DIAGNOSIS — J9621 Acute and chronic respiratory failure with hypoxia: Secondary | ICD-10-CM | POA: Diagnosis not present

## 2021-06-29 LAB — BASIC METABOLIC PANEL
Anion gap: 7 (ref 5–15)
BUN: 38 mg/dL — ABNORMAL HIGH (ref 8–23)
CO2: 26 mmol/L (ref 22–32)
Calcium: 9.2 mg/dL (ref 8.9–10.3)
Chloride: 105 mmol/L (ref 98–111)
Creatinine, Ser: 0.45 mg/dL (ref 0.44–1.00)
GFR, Estimated: >60 mL/min (ref >60)
Glucose, Bld: 131 mg/dL — ABNORMAL HIGH (ref 70–99)
Potassium: 4.7 mmol/L (ref 3.5–5.1)
Sodium: 138 mmol/L (ref 135–145)

## 2021-06-29 LAB — CBC
HCT: 28 % — ABNORMAL LOW (ref 36.0–46.0)
Hemoglobin: 8.5 g/dL — ABNORMAL LOW (ref 12.0–15.0)
MCH: 29.9 pg (ref 26.0–34.0)
MCHC: 30.4 g/dL (ref 30.0–36.0)
MCV: 98.6 fL (ref 80.0–100.0)
Platelets: 282 10*3/uL (ref 150–400)
RBC: 2.84 MIL/uL — ABNORMAL LOW (ref 3.87–5.11)
RDW: 16.3 % — ABNORMAL HIGH (ref 11.5–15.5)
WBC: 15.6 10*3/uL — ABNORMAL HIGH (ref 4.0–10.5)
nRBC: 0 % (ref 0.0–0.2)

## 2021-06-29 LAB — MAGNESIUM: Magnesium: 1.8 mg/dL (ref 1.7–2.4)

## 2021-06-29 NOTE — Progress Notes (Signed)
Pulmonary Critical Care Medicine ?[facility] [place] ?  ?PULMONARY CRITICAL CARE SERVICE ? ?PROGRESS NOTE ? ? ? ? ?[name]  ?GUY:[id number]  ?DOB: 03-08-1950  ? ?DOA: 06/15/2021 ? ?Referring Physician: Luna Kitchens, MD ? ?HPI: Sherry Mcmahon is a 71 y.o. female being followed for ventilator/airway/oxygen weaning Acute on Chronic Respiratory Failure.  Patient is on a pressure support wean the goal is for 12 hours today ? ?Medications: ?Reviewed on Rounds ? ?Physical Exam: ? ?Vitals: Temperature is 98.9 pulse of 80 respiratory 23 blood pressure 101/63 saturations 96% ? ?Ventilator Settings pressure support FiO2 28% pressure 12/5 ? ?General: Comfortable at this time ?Neck: supple ?Cardiovascular: no malignant arrhythmias ?Respiratory: Scattered rhonchi expansion is equal ?Skin: no rash seen on limited exam ?Musculoskeletal: No gross abnormality ?Psychiatric:unable to assess ?Neurologic:no involuntary movements   ?   ?   ?Lab Data:  ? ?Basic Metabolic Panel: ?Recent Labs  ?Lab 06/24/21 ?0435 06/26/21 ?0344 06/29/21 ?0455  ?NA 139 138 138  ?K 4.7 4.6 4.7  ?CL 112* 110 105  ?CO2 24 25 26   ?GLUCOSE 104* 123* 131*  ?BUN 52* 49* 38*  ?CREATININE 0.54 0.46 0.45  ?CALCIUM 9.5 9.3 9.2  ?MG 1.9 1.8 1.8  ? ? ?ABG: ?Recent Labs  ?Lab 06/24/21 ?1012  ?PHART 7.36  ?PCO2ART 45  ?PO2ART 80*  ?HCO3 25.4  ?O2SAT 99.1  ? ? ?Liver Function Tests: ?No results for input(s): AST, ALT, ALKPHOS, BILITOT, PROT, ALBUMIN in the last 168 hours. ?No results for input(s): LIPASE, AMYLASE in the last 168 hours. ?No results for input(s): AMMONIA in the last 168 hours. ? ?CBC: ?Recent Labs  ?Lab 06/24/21 ?0435 06/26/21 ?0344 06/29/21 ?0455  ?WBC 16.1* 13.9* 15.6*  ?HGB 8.9* 8.1* 8.5*  ?HCT 29.8* 26.2* 28.0*  ?MCV 100.0 98.1 98.6  ?PLT 289 252 282  ? ? ?Cardiac Enzymes: ?No results for input(s): CKTOTAL, CKMB, CKMBINDEX, TROPONINI in the last 168 hours. ? ?BNP (last 3 results) ?No results for input(s): BNP in the last 8760 hours. ? ?ProBNP  (last 3 results) ?No results for input(s): PROBNP in the last 8760 hours. ? ?Radiological Exams: ?DG Chest Port 1 View ? ?Result Date: 06/29/2021 ?CLINICAL DATA:  Follow-up pneumothorax. EXAM: PORTABLE CHEST 1 VIEW COMPARISON:  06/26/2021 FINDINGS: Rotational artifact tracheostomy tube tip is above the carina. There is a right-sided chest tube in place which is unchanged in position from previous exam. Right-sided pneumothorax is not visible on today's radiograph. Right scratch set trace pleural effusions. Mild interstitial edema. Airspace disease within the right lung is unchanged from previous exam. IMPRESSION: 1. Right chest tube in place, no pneumothorax identified on today's radiograph. 2. No change in aeration to the right lung. 3. Small pleural effusions. Electronically Signed   By: 06/28/2021 M.D.   On: 06/29/2021 05:51   ? ?Assessment/Plan ?Active Problems: ?  Acute on chronic respiratory failure with hypoxia (HCC) ?  CAP (community acquired pneumonia) ?  COPD mixed type (HCC) ?  Chronic diastolic CHF (congestive heart failure) (HCC) ?  Paroxysmal atrial fibrillation (HCC) ? ? ?Acute on chronic respiratory failure hypoxia we will continue with pressure support patient currently is on 28% FiO2 good saturations are noted ?Community-acquired pneumonia no change we will continue to follow along closely ?COPD mixed type supportive care ?Chronic diastolic heart failure at baseline ?Paroxysmal atrial fibrillation rate is controlled ? ? ?I have personally seen and evaluated the patient, evaluated laboratory and imaging results, formulated the assessment and plan and placed  orders. ?The Patient requires high complexity decision making with multiple systems involvement.  ?Rounds were done with the Respiratory Therapy Director and Staff therapists and discussed with nursing staff also. ? ?Allyne Gee, MD FCCP ?Pulmonary Critical Care Medicine ?Sleep Medicine ? ?

## 2021-06-29 NOTE — Progress Notes (Signed)
? ? ?Referring Physician(s): Dr. Owens Shark [facility] ? ?Supervising Physician: Corrie Mckusick ? ?Patient Status:  [facility] - In-pt ? ?Chief Complaint: ? ?Right PTX after attempted thoracentesis on 4/25  ?S/p 10 Fr right chest tube placement on 4/25, upsize to 14 Fr on 4/28  ? ?Subjective: ? ?Patient laying in bed, NAD.  ?On vent via trach, nods and shakes head to simple questions.  ?Denies shortness of breath today.  ? ? ?Allergies: ?Amlodipine ? ?Medications: ?Prior to Admission medications   ?Not on File  ? ? ? ?Vital Signs: ?BP 135/77 (BP Location: Left Leg)   Pulse 80   Resp (!) 23  ? ?Physical Exam ?Vitals reviewed.  ?HENT:  ?   Head: Normocephalic.  ?Pulmonary:  ?   Comments: On vent via trach ?Abdominal:  ?   General: Abdomen is flat.  ?Musculoskeletal:  ?   Cervical back: Neck supple.  ?Skin: ?   General: Skin is warm and dry.  ?   Comments: Positive right chest tube to Pleur evac. Site is unremarkable with no erythema, edema, tenderness, bleeding or drainage. Suture and stat lock in place. Dressing is clean, dry, and intact. ~2L of  hazy yellow colored fluid noted in the Pleur evac.  Small granulosus tissue noted in the chest tube.   ?Neurological:  ?   Mental Status: She is alert. Mental status is at baseline.  ?Psychiatric:     ?   Behavior: Behavior normal.  ? ? ?Imaging: ?DG Chest 1 View ? ?Result Date: 06/26/2021 ?CLINICAL DATA:  Pneumothorax EXAM: CHEST  1 VIEW COMPARISON:  06/24/2021 FINDINGS: Patient is rotated. Increased caliber of chest tube with repositioning more superiorly. Tracheostomy device is again noted. Pneumothorax is stable to decreased. Pleural effusions are more apparent on prior chest CT. Persistent opacities of the right much greater than left lungs. Right lung aeration is slightly better. Similar cardiomediastinal contours. IMPRESSION: Stable to decreased right pneumothorax. Right lung aeration is slightly better. Electronically Signed   By: Macy Mis M.D.   On: 06/26/2021 12:10  ? ?DG  Chest Port 1 View ? ?Result Date: 06/29/2021 ?CLINICAL DATA:  Follow-up pneumothorax. EXAM: PORTABLE CHEST 1 VIEW COMPARISON:  06/26/2021 FINDINGS: Rotational artifact tracheostomy tube tip is above the carina. There is a right-sided chest tube in place which is unchanged in position from previous exam. Right-sided pneumothorax is not visible on today's radiograph. Right scratch set trace pleural effusions. Mild interstitial edema. Airspace disease within the right lung is unchanged from previous exam. IMPRESSION: 1. Right chest tube in place, no pneumothorax identified on today's radiograph. 2. No change in aeration to the right lung. 3. Small pleural effusions. Electronically Signed   By: Kerby Moors M.D.   On: 06/29/2021 05:51   ? ?Labs: ? ?CBC: ?Recent Labs  ?  06/21/21 ?0912 06/24/21 ?0435 06/26/21 ?0344 06/29/21 ?0455  ?WBC 18.3* 16.1* 13.9* 15.6*  ?HGB 9.2* 8.9* 8.1* 8.5*  ?HCT 30.2* 29.8* 26.2* 28.0*  ?PLT 381 289 252 282  ? ? ? ?COAGS: ?Recent Labs  ?  06/15/2021 ?0446  ?INR 1.4*  ? ? ? ?BMP: ?Recent Labs  ?  06/21/21 ?0912 06/24/21 ?0435 06/26/21 ?0344 06/29/21 ?0455  ?NA 138 139 138 138  ?K 4.3 4.7 4.6 4.7  ?CL 109 112* 110 105  ?CO2 21* 24 25 26   ?GLUCOSE 114* 104* 123* 131*  ?BUN 49* 52* 49* 38*  ?CALCIUM 9.4 9.5 9.3 9.2  ?CREATININE 0.61 0.54 0.46 0.45  ?GFRNONAA >60 >60 >60 >60  ? ? ? ?  LIVER FUNCTION TESTS: ?Recent Labs  ?  06/17/2021 ?0446 06/21/21 ?0912  ?BILITOT 0.6 0.3  ?AST 16 15  ?ALT 17 16  ?ALKPHOS 87 78  ?PROT 5.5* 5.6*  ?ALBUMIN 1.7* 1.5*  ? ? ? ?Assessment and Plan: ? ?71 y.o. female with hx COPD and acute on chronic respiratory failure, CAP on abx, who developed right PTX after attempted thoracentesis on 4/25.  ? ?IR interventions: ?- 4/25: attempted right thoracentesis  ?- 4/25: right 10 Fr chest tube placement by Dr. Maryelizabeth Kaufmann ?- 4/27: CT chest showed persistent PTX ?- 4/28: right chest tube exchanged and upsized to 14 Fr by Dr. Laurence Ferrari ? ?CXR this morning  showed resolution of the right  pneumothorax.  ?Discussed with Dr. Earleen Newport, will place chest tube on water seal today and repeat CXR tomorrow.  ? ?Of note, Dr. Anselm Pancoast stated that with patient's extensive pulmonary disease seen on CT chest, the right lung may never fully re-expand.  ? ?PLAN ?- chest tube to water seal ?- repeat CXR tomorrow ?- if CXR showed no PTX, will consider chest tube removal at bedside ? ?IR to follow.  ? ? ?Electronically Signed: ?Tera Mater, PA-C ?06/29/2021, 2:36 PM ? ? ?I spent a total of 15 Minutes at the the patient's bedside AND on the patient's hospital floor or unit, greater than 50% of which was counseling/coordinating care for right chest tube. ? ?This chart was dictated using voice recognition software.  Despite best efforts to proofread,  errors can occur which can change the documentation meaning.  ? ?

## 2021-06-30 ENCOUNTER — Other Ambulatory Visit (HOSPITAL_COMMUNITY): Payer: Medicare PPO

## 2021-06-30 DIAGNOSIS — I5032 Chronic diastolic (congestive) heart failure: Secondary | ICD-10-CM | POA: Diagnosis not present

## 2021-06-30 DIAGNOSIS — J189 Pneumonia, unspecified organism: Secondary | ICD-10-CM | POA: Diagnosis not present

## 2021-06-30 DIAGNOSIS — J449 Chronic obstructive pulmonary disease, unspecified: Secondary | ICD-10-CM | POA: Diagnosis not present

## 2021-06-30 DIAGNOSIS — J9621 Acute and chronic respiratory failure with hypoxia: Secondary | ICD-10-CM | POA: Diagnosis not present

## 2021-06-30 NOTE — Progress Notes (Signed)
Pulmonary Critical Care Medicine ?[facility] [place] ?  ?PULMONARY CRITICAL CARE SERVICE ? ?PROGRESS NOTE ? ? ? ? ?Sherry Mcmahon  ?[id number]  ?DOB: 1950/11/21  ? ?DOA: 2021-06-26 ? ?Referring Physician: Luna Kitchens, MD ? ?HPI: Sherry Mcmahon is a 71 y.o. female being followed for ventilator/airway/oxygen weaning Acute on Chronic Respiratory Failure.  Patient currently is on pressure support has been on 28% FiO2 goal of 16 hours ? ?Medications: ?Reviewed on Rounds ? ?Physical Exam: ? ?Vitals: Temperature is 98.5 pulse of 80 respiratory 27 blood pressure 92/56 saturations 96% ? ?Ventilator Settings pressure support FiO2 28% pressure 12/5 ? ?General: Comfortable at this time ?Neck: supple ?Cardiovascular: no malignant arrhythmias ?Respiratory: Scattered rhonchi coarse breath sounds ?Skin: no rash seen on limited exam ?Musculoskeletal: No gross abnormality ?Psychiatric:unable to assess ?Neurologic:no involuntary movements   ?   ?   ?Lab Data:  ? ?Basic Metabolic Panel: ?Recent Labs  ?Lab 06/24/21 ?0435 06/26/21 ?0344 06/29/21 ?0455  ?NA 139 138 138  ?K 4.7 4.6 4.7  ?CL 112* 110 105  ?CO2 24 25 26   ?GLUCOSE 104* 123* 131*  ?BUN 52* 49* 38*  ?CREATININE 0.54 0.46 0.45  ?CALCIUM 9.5 9.3 9.2  ?MG 1.9 1.8 1.8  ? ? ?ABG: ?Recent Labs  ?Lab 06/24/21 ?1012  ?PHART 7.36  ?PCO2ART 45  ?PO2ART 80*  ?HCO3 25.4  ?O2SAT 99.1  ? ? ?Liver Function Tests: ?No results for input(s): AST, ALT, ALKPHOS, BILITOT, PROT, ALBUMIN in the last 168 hours. ?No results for input(s): LIPASE, AMYLASE in the last 168 hours. ?No results for input(s): AMMONIA in the last 168 hours. ? ?CBC: ?Recent Labs  ?Lab 06/24/21 ?0435 06/26/21 ?0344 06/29/21 ?0455  ?WBC 16.1* 13.9* 15.6*  ?HGB 8.9* 8.1* 8.5*  ?HCT 29.8* 26.2* 28.0*  ?MCV 100.0 98.1 98.6  ?PLT 289 252 282  ? ? ?Cardiac Enzymes: ?No results for input(s): CKTOTAL, CKMB, CKMBINDEX, TROPONINI in the last 168 hours. ? ?BNP (last 3 results) ?No results for input(s): BNP in the last 8760  hours. ? ?ProBNP (last 3 results) ?No results for input(s): PROBNP in the last 8760 hours. ? ?Radiological Exams: ?DG Chest Port 1 View ? ?Result Date: 06/30/2021 ?CLINICAL DATA:  Chest tube.  Right pneumothorax. EXAM: PORTABLE CHEST 1 VIEW COMPARISON:  06/29/2021 FINDINGS: Aeration in the left lung appears improved in the interval. There is persistent consolidative opacity in the mid right lung with probable loculated small anterior pneumothorax towards the apex. Right chest tube again noted. The cardio pericardial silhouette is enlarged. Tracheostomy tube and left-sided permanent pacemaker again noted. IMPRESSION: 1. Interval improvement in aeration in the left lung. 2. Persistent consolidative opacity in the mid right lung with probable small loculated anterior pneumothorax towards the apex. Electronically Signed   By: 08/29/2021 M.D.   On: 06/30/2021 06:41  ? ?DG Chest Port 1 View ? ?Result Date: 06/29/2021 ?CLINICAL DATA:  Follow-up pneumothorax. EXAM: PORTABLE CHEST 1 VIEW COMPARISON:  06/26/2021 FINDINGS: Rotational artifact tracheostomy tube tip is above the carina. There is a right-sided chest tube in place which is unchanged in position from previous exam. Right-sided pneumothorax is not visible on today's radiograph. Right scratch set trace pleural effusions. Mild interstitial edema. Airspace disease within the right lung is unchanged from previous exam. IMPRESSION: 1. Right chest tube in place, no pneumothorax identified on today's radiograph. 2. No change in aeration to the right lung. 3. Small pleural effusions. Electronically Signed   By: 06/28/2021 M.D.   On:  06/29/2021 05:51   ? ?Assessment/Plan ?Active Problems: ?  Acute on chronic respiratory failure with hypoxia (HCC) ?  CAP (community acquired pneumonia) ?  COPD mixed type (HCC) ?  Chronic diastolic CHF (congestive heart failure) (HCC) ?  Paroxysmal atrial fibrillation (HCC) ? ? ?Acute on chronic respiratory failure hypoxia on pressure  support at this time seems to be doing okay weaning on pressure support with a goal of 16 hours ?Communicare pneumonia supportive care no change ?Pneumothorax being seen by interventional radiology ?COPD nebulizers medical management ?Chronic diastolic heart failure compensated ?Paroxysmal atrial fibrillation rate controlled ? ? ?I have personally seen and evaluated the patient, evaluated laboratory and imaging results, formulated the assessment and plan and placed orders. ?The Patient requires high complexity decision making with multiple systems involvement.  ?Rounds were done with the Respiratory Therapy Director and Staff therapists and discussed with nursing staff also. ? ?Yevonne Pax, MD FCCP ?Pulmonary Critical Care Medicine ?Sleep Medicine ? ?

## 2021-06-30 NOTE — Progress Notes (Signed)
CXR reviewed by Dr. Vernard Gambles who notes slight interval improvement in pneumothorax. Patient with extensive lung disease and there is concern that the lung may never fully re-expand because of this. As such the following options were discussed with Dr. Owens Shark today: ? ?Leave the tube in place for now and intermittently follow, knowing that there is no clear endpoint at this time ?Remove the tube today and monitor for return of ptx ?Clamp the tube, repeat CXR in 4 hours and if unchanged remove tube ? ?After discussion Dr. Owens Shark prefers to leave the chest tube in place at this time and have IR follow up next week.  ? ?No plans to repeat CXR this week, will check CXR next week prior to re-assessment of chest tube. Dr. Owens Shark will call IR with any questions or concerns in the interim. ? ?Chest tube to remain to water seal at this time. ? ?Candiss Norse, PA-C ?

## 2021-07-01 DIAGNOSIS — J449 Chronic obstructive pulmonary disease, unspecified: Secondary | ICD-10-CM | POA: Diagnosis not present

## 2021-07-01 DIAGNOSIS — J9621 Acute and chronic respiratory failure with hypoxia: Secondary | ICD-10-CM | POA: Diagnosis not present

## 2021-07-01 DIAGNOSIS — J189 Pneumonia, unspecified organism: Secondary | ICD-10-CM | POA: Diagnosis not present

## 2021-07-01 DIAGNOSIS — I5032 Chronic diastolic (congestive) heart failure: Secondary | ICD-10-CM | POA: Diagnosis not present

## 2021-07-01 NOTE — Progress Notes (Signed)
Pulmonary Critical Care Medicine ?[facility] [place] ?  ?PULMONARY CRITICAL CARE SERVICE ? ?PROGRESS NOTE ? ? ? ? ?Sherry Mcmahon  ?[id number]  ?DOB: 02/08/1951  ? ?DOA: 06/26/2021 ? ?Referring Physician: Luna Kitchens, MD ? ?HPI: Sherry Mcmahon is a 71 y.o. female being followed for ventilator/airway/oxygen weaning Acute on Chronic Respiratory Failure.  Appreciate the radiology input patient is resting comfortably at this time without distress. ? ?Medications: ?Reviewed on Rounds ? ?Physical Exam: ? ?Vitals: Temperature is 97.3 pulse 80 respiratory is 30 blood pressure is 90/50 saturations 98% ? ?Ventilator Settings pressure support FiO2 is 28% pressure 12/5 ? ?General: Comfortable at this time ?Neck: supple ?Cardiovascular: no malignant arrhythmias ?Respiratory: No rhonchi very coarse breath sounds ?Skin: no rash seen on limited exam ?Musculoskeletal: No gross abnormality ?Psychiatric:unable to assess ?Neurologic:no involuntary movements   ?   ?   ?Lab Data:  ? ?Basic Metabolic Panel: ?Recent Labs  ?Lab 06/26/21 ?0344 06/29/21 ?0455  ?NA 138 138  ?K 4.6 4.7  ?CL 110 105  ?CO2 25 26  ?GLUCOSE 123* 131*  ?BUN 49* 38*  ?CREATININE 0.46 0.45  ?CALCIUM 9.3 9.2  ?MG 1.8 1.8  ? ? ?ABG: ?Recent Labs  ?Lab 06/24/21 ?1012  ?PHART 7.36  ?PCO2ART 45  ?PO2ART 80*  ?HCO3 25.4  ?O2SAT 99.1  ? ? ?Liver Function Tests: ?No results for input(s): AST, ALT, ALKPHOS, BILITOT, PROT, ALBUMIN in the last 168 hours. ?No results for input(s): LIPASE, AMYLASE in the last 168 hours. ?No results for input(s): AMMONIA in the last 168 hours. ? ?CBC: ?Recent Labs  ?Lab 06/26/21 ?0344 06/29/21 ?0455  ?WBC 13.9* 15.6*  ?HGB 8.1* 8.5*  ?HCT 26.2* 28.0*  ?MCV 98.1 98.6  ?PLT 252 282  ? ? ?Cardiac Enzymes: ?No results for input(s): CKTOTAL, CKMB, CKMBINDEX, TROPONINI in the last 168 hours. ? ?BNP (last 3 results) ?No results for input(s): BNP in the last 8760 hours. ? ?ProBNP (last 3 results) ?No results for input(s): PROBNP in the last 8760  hours. ? ?Radiological Exams: ?DG Chest Port 1 View ? ?Result Date: 06/30/2021 ?CLINICAL DATA:  Chest tube.  Right pneumothorax. EXAM: PORTABLE CHEST 1 VIEW COMPARISON:  06/29/2021 FINDINGS: Aeration in the left lung appears improved in the interval. There is persistent consolidative opacity in the mid right lung with probable loculated small anterior pneumothorax towards the apex. Right chest tube again noted. The cardio pericardial silhouette is enlarged. Tracheostomy tube and left-sided permanent pacemaker again noted. IMPRESSION: 1. Interval improvement in aeration in the left lung. 2. Persistent consolidative opacity in the mid right lung with probable small loculated anterior pneumothorax towards the apex. Electronically Signed   By: Kennith Center M.D.   On: 06/30/2021 06:41   ? ?Assessment/Plan ?Active Problems: ?  Acute on chronic respiratory failure with hypoxia (HCC) ?  CAP (community acquired pneumonia) ?  COPD mixed type (HCC) ?  Chronic diastolic CHF (congestive heart failure) (HCC) ?  Paroxysmal atrial fibrillation (HCC) ? ? ?Acute on chronic respiratory failure hypoxia we will continue with pressure support mode on 28% FiO2 with a pressure of 12/5 ?Community-acquired pneumonia no change we will continue to follow along closely ?COPD medical management ?Chronic diastolic heart failure at baseline ?Paroxysmal atrial fibrillation rate is controlled ? ? ?I have personally seen and evaluated the patient, evaluated laboratory and imaging results, formulated the assessment and plan and placed orders. ?The Patient requires high complexity decision making with multiple systems involvement.  ?Rounds were done with the Respiratory  Therapy Director and Staff therapists and discussed with nursing staff also. ? ?Allyne Gee, MD FCCP ?Pulmonary Critical Care Medicine ?Sleep Medicine ? ?

## 2021-07-02 ENCOUNTER — Other Ambulatory Visit (HOSPITAL_COMMUNITY): Payer: Medicare PPO

## 2021-07-02 DIAGNOSIS — I5032 Chronic diastolic (congestive) heart failure: Secondary | ICD-10-CM | POA: Diagnosis not present

## 2021-07-02 DIAGNOSIS — J449 Chronic obstructive pulmonary disease, unspecified: Secondary | ICD-10-CM | POA: Diagnosis not present

## 2021-07-02 DIAGNOSIS — J9621 Acute and chronic respiratory failure with hypoxia: Secondary | ICD-10-CM | POA: Diagnosis not present

## 2021-07-02 DIAGNOSIS — J189 Pneumonia, unspecified organism: Secondary | ICD-10-CM | POA: Diagnosis not present

## 2021-07-02 LAB — COMPREHENSIVE METABOLIC PANEL
ALT: 13 U/L (ref 0–44)
AST: 15 U/L (ref 15–41)
Albumin: <1.5 g/dL — ABNORMAL LOW (ref 3.5–5.0)
Alkaline Phosphatase: 70 U/L (ref 38–126)
Anion gap: 6 (ref 5–15)
BUN: 62 mg/dL — ABNORMAL HIGH (ref 8–23)
CO2: 26 mmol/L (ref 22–32)
Calcium: 8.9 mg/dL (ref 8.9–10.3)
Chloride: 106 mmol/L (ref 98–111)
Creatinine, Ser: 0.74 mg/dL (ref 0.44–1.00)
GFR, Estimated: >60 mL/min (ref >60)
Glucose, Bld: 135 mg/dL — ABNORMAL HIGH (ref 70–99)
Potassium: 6 mmol/L — ABNORMAL HIGH (ref 3.5–5.1)
Sodium: 138 mmol/L (ref 135–145)
Total Bilirubin: 0.4 mg/dL (ref 0.3–1.2)
Total Protein: 4.8 g/dL — ABNORMAL LOW (ref 6.5–8.1)

## 2021-07-02 LAB — CBC
HCT: 23.6 % — ABNORMAL LOW (ref 36.0–46.0)
Hemoglobin: 7 g/dL — ABNORMAL LOW (ref 12.0–15.0)
MCH: 30 pg (ref 26.0–34.0)
MCHC: 29.7 g/dL — ABNORMAL LOW (ref 30.0–36.0)
MCV: 101.3 fL — ABNORMAL HIGH (ref 80.0–100.0)
Platelets: 274 10*3/uL (ref 150–400)
RBC: 2.33 MIL/uL — ABNORMAL LOW (ref 3.87–5.11)
RDW: 16.7 % — ABNORMAL HIGH (ref 11.5–15.5)
WBC: 10.8 10*3/uL — ABNORMAL HIGH (ref 4.0–10.5)
nRBC: 0 % (ref 0.0–0.2)

## 2021-07-02 LAB — C-REACTIVE PROTEIN: CRP: 1.9 mg/dL — ABNORMAL HIGH (ref <1.0)

## 2021-07-02 LAB — POTASSIUM: Potassium: 5.6 mmol/L — ABNORMAL HIGH (ref 3.5–5.1)

## 2021-07-02 NOTE — Progress Notes (Signed)
Pulmonary Critical Care Medicine ?[place] ?  ?PULMONARY CRITICAL CARE SERVICE ? ?PROGRESS NOTE ? ? ? ? ?Tomi Likens  ?[id number]  ?DOB: 28-Apr-1950  ? ?DOA: 06/04/2021 ? ?Referring Physician: Satira Sark, MD ? ?HPI: ERICCA ZUHLKE is a 71 y.o. female being followed for ventilator/airway/oxygen weaning Acute on Chronic Respiratory Failure.  Patient is on T collar trials supposed to do 4 hours today ? ?Medications: ?Reviewed on Rounds ? ?Physical Exam: ? ?Vitals: Temperature is 97.8 pulse 80 respiratory 28 blood pressure is 113/50 saturations 100% ? ?Ventilator Settings T collar ? ?General: Comfortable at this time ?Neck: supple ?Cardiovascular: no malignant arrhythmias ?Respiratory: Coarse rhonchi expansion is equal ?Skin: no rash seen on limited exam ?Musculoskeletal: No gross abnormality ?Psychiatric:unable to assess ?Neurologic:no involuntary movements   ?   ?   ?Lab Data:  ? ?Basic Metabolic Panel: ?Recent Labs  ?Lab 06/26/21 ?[id number] 06/29/21 ?[id number] 07/02/21 ?0423  ?NA 138 138 138  ?K 4.6 4.7 6.0*  ?CL 110 105 106  ?CO2 25 26 26   ?GLUCOSE 123* 131* 135*  ?BUN 49* 38* 62*  ?CREATININE 0.46 0.45 0.74  ?CALCIUM 9.3 9.2 8.9  ?MG 1.8 1.8  --   ? ? ?ABG: ?No results for input(s): PHART, PCO2ART, PO2ART, HCO3, O2SAT in the last 168 hours. ? ?Liver Function Tests: ?Recent Labs  ?Lab 07/02/21 ?0423  ?AST 15  ?ALT 13  ?ALKPHOS 70  ?BILITOT 0.4  ?PROT 4.8*  ?ALBUMIN <1.5*  ? ?No results for input(s): LIPASE, AMYLASE in the last 168 hours. ?No results for input(s): AMMONIA in the last 168 hours. ? ?CBC: ?Recent Labs  ?Lab 06/26/21 ?[id number] 06/29/21 ?[id number] 07/02/21 ?0423  ?WBC 13.9* 15.6* 10.8*  ?HGB 8.1* 8.5* 7.0*  ?HCT 26.2* 28.0* 23.6*  ?MCV 98.1 98.6 101.3*  ?PLT 252 282 274  ? ? ?Cardiac Enzymes: ?No results for input(s): CKTOTAL, CKMB, CKMBINDEX, TROPONINI in the last 168 hours. ? ?BNP (last 3 results) ?No results for input(s): BNP in the last 8760 hours. ? ?ProBNP (last 3 results) ?No results for  input(s): PROBNP in the last 8760 hours. ? ?Radiological Exams: ?DG Chest 1 View ? ?Result Date: 07/02/2021 ?CLINICAL DATA:  Right chest tube, pneumothorax, follow-up. EXAM: CHEST  1 VIEW COMPARISON:  06/30/2021. FINDINGS: 5:15 a.m., 07/02/2021. Pigtail right chest tube positioning is unchanged, the tip superimposing medially at roughly the mid chest. Tracheostomy cannula positioning is also unaltered as well as left chest dual lead pacing system and wiring. A pleural line is again noted in right apical area, probably anterior there are superimposed lung markings peripheral to this. Difficult to assess the size of the pneumothorax but is probably approximally 20%, without shift of mediastinum. There is continued dense patchy consolidation in the right mid and lower lung fields, also unchanged. No significant pleural fluid is seen. Left lung is clear with COPD change. The cardiomediastinal silhouette Is stable. IMPRESSION: No changes in the overall aeration. Estimated approximately 20% right apical pneumothorax without mediastinal shift. No change in the right lung opacities. Electronically Signed   By: Telford Nab M.D.   On: 07/02/2021 05:38   ? ?Assessment/Plan ?Active Problems: ?  Acute on chronic respiratory failure with hypoxia (HCC) ?  CAP (community acquired pneumonia) ?  COPD mixed type ([place]) ?  Chronic diastolic CHF (congestive heart failure) ([place]) ?  Paroxysmal atrial fibrillation (HCC) ? ? ?Acute on chronic respiratory failure hypoxia we will continue with the weaning process on T collar trials as tolerated continue secretion  management supportive care. ?Community-acquired pneumonia has been treated with antibiotics we will continue to follow along closely ?COPD severe disease supportive care ?Chronic diastolic heart failure appears to be compensated ?Paroxysmal atrial fibrillation rate is controlled ? ? ?I have personally seen and evaluated the patient, evaluated laboratory and imaging results,  formulated the assessment and plan and placed orders. ?The Patient requires high complexity decision making with multiple systems involvement.  ?Rounds were done with the Respiratory Therapy Director and Staff therapists and discussed with nursing staff also. ? ?Allyne Gee, MD FCCP ?Pulmonary Critical Care Medicine ?Sleep Medicine ? ?

## 2021-07-03 LAB — BASIC METABOLIC PANEL
Anion gap: 7 (ref 5–15)
BUN: 65 mg/dL — ABNORMAL HIGH (ref 8–23)
CO2: 28 mmol/L (ref 22–32)
Calcium: 9 mg/dL (ref 8.9–10.3)
Chloride: 105 mmol/L (ref 98–111)
Creatinine, Ser: 0.72 mg/dL (ref 0.44–1.00)
GFR, Estimated: >60 mL/min (ref >60)
Glucose, Bld: 138 mg/dL — ABNORMAL HIGH (ref 70–99)
Potassium: 5.1 mmol/L (ref 3.5–5.1)
Sodium: 140 mmol/L (ref 135–145)

## 2021-07-03 LAB — CBC
HCT: 25.3 % — ABNORMAL LOW (ref 36.0–46.0)
Hemoglobin: 7.6 g/dL — ABNORMAL LOW (ref 12.0–15.0)
MCH: 29.8 pg (ref 26.0–34.0)
MCHC: 30 g/dL (ref 30.0–36.0)
MCV: 99.2 fL (ref 80.0–100.0)
Platelets: 330 10*3/uL (ref 150–400)
RBC: 2.55 MIL/uL — ABNORMAL LOW (ref 3.87–5.11)
RDW: 16.6 % — ABNORMAL HIGH (ref 11.5–15.5)
WBC: 15.4 10*3/uL — ABNORMAL HIGH (ref 4.0–10.5)
nRBC: 0 % (ref 0.0–0.2)

## 2021-07-04 LAB — BASIC METABOLIC PANEL
Anion gap: 6 (ref 5–15)
BUN: 63 mg/dL — ABNORMAL HIGH (ref 8–23)
CO2: 31 mmol/L (ref 22–32)
Calcium: 8.8 mg/dL — ABNORMAL LOW (ref 8.9–10.3)
Chloride: 105 mmol/L (ref 98–111)
Creatinine, Ser: 0.74 mg/dL (ref 0.44–1.00)
GFR, Estimated: >60 mL/min (ref >60)
Glucose, Bld: 133 mg/dL — ABNORMAL HIGH (ref 70–99)
Potassium: 4.3 mmol/L (ref 3.5–5.1)
Sodium: 142 mmol/L (ref 135–145)

## 2021-07-04 LAB — CULTURE, RESPIRATORY W GRAM STAIN: Special Requests: NORMAL

## 2021-07-05 DIAGNOSIS — I5032 Chronic diastolic (congestive) heart failure: Secondary | ICD-10-CM | POA: Diagnosis not present

## 2021-07-05 DIAGNOSIS — J449 Chronic obstructive pulmonary disease, unspecified: Secondary | ICD-10-CM | POA: Diagnosis not present

## 2021-07-05 DIAGNOSIS — J9621 Acute and chronic respiratory failure with hypoxia: Secondary | ICD-10-CM | POA: Diagnosis not present

## 2021-07-05 DIAGNOSIS — J189 Pneumonia, unspecified organism: Secondary | ICD-10-CM | POA: Diagnosis not present

## 2021-07-05 LAB — CBC WITH DIFFERENTIAL/PLATELET
Abs Immature Granulocytes: 0.06 10*3/uL (ref 0.00–0.07)
Basophils Absolute: 0 10*3/uL (ref 0.0–0.1)
Basophils Relative: 0 %
Eosinophils Absolute: 0.2 10*3/uL (ref 0.0–0.5)
Eosinophils Relative: 3 %
HCT: 24.8 % — ABNORMAL LOW (ref 36.0–46.0)
Hemoglobin: 7.5 g/dL — ABNORMAL LOW (ref 12.0–15.0)
Immature Granulocytes: 1 %
Lymphocytes Relative: 7 %
Lymphs Abs: 0.6 10*3/uL — ABNORMAL LOW (ref 0.7–4.0)
MCH: 30.2 pg (ref 26.0–34.0)
MCHC: 30.2 g/dL (ref 30.0–36.0)
MCV: 100 fL (ref 80.0–100.0)
Monocytes Absolute: 1 10*3/uL (ref 0.1–1.0)
Monocytes Relative: 12 %
Neutro Abs: 6.6 10*3/uL (ref 1.7–7.7)
Neutrophils Relative %: 77 %
Platelets: 288 10*3/uL (ref 150–400)
RBC: 2.48 MIL/uL — ABNORMAL LOW (ref 3.87–5.11)
RDW: 16.4 % — ABNORMAL HIGH (ref 11.5–15.5)
WBC: 8.6 10*3/uL (ref 4.0–10.5)
nRBC: 0 % (ref 0.0–0.2)

## 2021-07-05 LAB — COMPREHENSIVE METABOLIC PANEL
ALT: 16 U/L (ref 0–44)
AST: 19 U/L (ref 15–41)
Albumin: 1.6 g/dL — ABNORMAL LOW (ref 3.5–5.0)
Alkaline Phosphatase: 79 U/L (ref 38–126)
Anion gap: 11 (ref 5–15)
BUN: 63 mg/dL — ABNORMAL HIGH (ref 8–23)
CO2: 30 mmol/L (ref 22–32)
Calcium: 8.7 mg/dL — ABNORMAL LOW (ref 8.9–10.3)
Chloride: 103 mmol/L (ref 98–111)
Creatinine, Ser: 0.72 mg/dL (ref 0.44–1.00)
GFR, Estimated: >60 mL/min (ref >60)
Glucose, Bld: 128 mg/dL — ABNORMAL HIGH (ref 70–99)
Potassium: 4.3 mmol/L (ref 3.5–5.1)
Sodium: 144 mmol/L (ref 135–145)
Total Bilirubin: 0.8 mg/dL (ref 0.3–1.2)
Total Protein: 5.8 g/dL — ABNORMAL LOW (ref 6.5–8.1)

## 2021-07-05 LAB — C-REACTIVE PROTEIN: CRP: 2.2 mg/dL — ABNORMAL HIGH (ref <1.0)

## 2021-07-05 NOTE — Progress Notes (Addendum)
Pulmonary Critical Care Medicine ?[facility] [place] ?  ?PULMONARY CRITICAL CARE SERVICE ? ?PROGRESS NOTE ? ? ? ? ?Sherry Mcmahon  ?[id number]  ?DOB: 11/14/1950  ? ?DOA: 06/25/2021 ? ?Referring Physician: Luna Kitchens, MD ? ?HPI: Sherry Mcmahon is a 71 y.o. female being followed for ventilator/airway/oxygen weaning Acute on Chronic Respiratory Failure. Patient seen sitting in chair, chest tube to suction. She continues to have a moderate amount of secretions. No acute distress, no acute overnight events. ? ?Medications: ?Reviewed on Rounds ? ?Physical Exam: ? ?Vitals: temp 97.1, pulse 80, respirations 15, bp 160/93, sp02 100% ? ?Ventilator Settings ATC 28% ? ?General: Comfortable at this time ?Neck: supple ?Cardiovascular: no malignant arrhythmias ?Respiratory: Bilaterally diminished  ?Skin: no rash seen on limited exam ?Musculoskeletal: No gross abnormality ?Psychiatric:unable to assess ?Neurologic:no involuntary movements   ?   ?   ?Lab Data:  ? ?Basic Metabolic Panel: ?Recent Labs  ?Lab 06/29/21 ?0455 07/02/21 ?0423 07/02/21 ?1816 07/03/21 ?4034 07/04/21 ?7425 07/05/21 ?0408  ?NA 138 138  --  140 142 144  ?K 4.7 6.0* 5.6* 5.1 4.3 4.3  ?CL 105 106  --  105 105 103  ?CO2 26 26  --  28 31 30   ?GLUCOSE 131* 135*  --  138* 133* 128*  ?BUN 38* 62*  --  65* 63* 63*  ?CREATININE 0.45 0.74  --  0.72 0.74 0.72  ?CALCIUM 9.2 8.9  --  9.0 8.8* 8.7*  ?MG 1.8  --   --   --   --   --   ? ? ?ABG: ?No results for input(s): PHART, PCO2ART, PO2ART, HCO3, O2SAT in the last 168 hours. ? ?Liver Function Tests: ?Recent Labs  ?Lab 07/02/21 ?0423 07/05/21 ?0408  ?AST 15 19  ?ALT 13 16  ?ALKPHOS 70 79  ?BILITOT 0.4 0.8  ?PROT 4.8* 5.8*  ?ALBUMIN <1.5* 1.6*  ? ?No results for input(s): LIPASE, AMYLASE in the last 168 hours. ?No results for input(s): AMMONIA in the last 168 hours. ? ?CBC: ?Recent Labs  ?Lab 06/29/21 ?0455 07/02/21 ?0423 07/03/21 ?0444 07/05/21 ?0408  ?WBC 15.6* 10.8* 15.4* 8.6  ?NEUTROABS  --   --   --  6.6  ?HGB  8.5* 7.0* 7.6* 7.5*  ?HCT 28.0* 23.6* 25.3* 24.8*  ?MCV 98.6 101.3* 99.2 100.0  ?PLT 282 274 330 288  ? ? ?Cardiac Enzymes: ?No results for input(s): CKTOTAL, CKMB, CKMBINDEX, TROPONINI in the last 168 hours. ? ?BNP (last 3 results) ?No results for input(s): BNP in the last 8760 hours. ? ?ProBNP (last 3 results) ?No results for input(s): PROBNP in the last 8760 hours. ? ?Radiological Exams: ?No results found. ? ?Assessment/Plan ?Active Problems: ?  Acute on chronic respiratory failure with hypoxia (HCC) ?  CAP (community acquired pneumonia) ?  COPD mixed type (HCC) ?  Chronic diastolic CHF (congestive heart failure) (HCC) ?  Paroxysmal atrial fibrillation (HCC) ? ?Acute on chronic respiratory failure hypoxia -she completed 12 hours of ATC yesterday and will move forward with 16 hours of ATC today. Continue with pulmonary toilet. ?Community-acquired pneumonia -has been treated with antibiotics, we will continue to follow along closely. ?COPD -severe disease , continue with supportive care. ?Chronic diastolic heart failure- appears to be compensated, continue monitoring fluid status. ?Paroxysmal atrial fibrillation- currently rate is controlled, continue with medical management. ? ? ?I have personally seen and evaluated the patient, evaluated laboratory and imaging results, formulated the assessment and plan and placed orders. ?The Patient requires high complexity decision  making with multiple systems involvement.  ?Rounds were done with the Respiratory Therapy Director and Staff therapists and discussed with nursing staff also. ? ?Allyne Gee, MD FCCP ?Pulmonary Critical Care Medicine ?Sleep Medicine  ?

## 2021-07-06 ENCOUNTER — Other Ambulatory Visit (HOSPITAL_COMMUNITY): Payer: Medicare PPO

## 2021-07-06 DIAGNOSIS — J189 Pneumonia, unspecified organism: Secondary | ICD-10-CM | POA: Diagnosis not present

## 2021-07-06 DIAGNOSIS — I5032 Chronic diastolic (congestive) heart failure: Secondary | ICD-10-CM | POA: Diagnosis not present

## 2021-07-06 DIAGNOSIS — J449 Chronic obstructive pulmonary disease, unspecified: Secondary | ICD-10-CM | POA: Diagnosis not present

## 2021-07-06 DIAGNOSIS — J9621 Acute and chronic respiratory failure with hypoxia: Secondary | ICD-10-CM | POA: Diagnosis not present

## 2021-07-06 NOTE — Progress Notes (Addendum)
Pulmonary Critical Care Medicine ?[facility] [place] ?  ?PULMONARY CRITICAL CARE SERVICE ? ?PROGRESS NOTE ? ? ? ? ?[name]  ?[id number]  ?DOB: 13-Jan-1951  ? ?DOA: 06/19/2021 ? ?Referring Physician: Luna Kitchens, MD ? ?HPI: Sherry Mcmahon is a 71 y.o. female being followed for ventilator/airway/oxygen weaning Acute on Chronic Respiratory Failure.  Patient seen lying in bed.  Chest tube with suction in place, chest tube remains in place, continues to have moderate amount of output.  She was placed on pressure support this morning and unfortunately was pulling low tidal volumes was placed back on full support.  No acute distress, no acute overnight events. ? ?Medications: ?Reviewed on Rounds ? ?Physical Exam: ? ?Vitals: 100.7, pulse 80, respirations 30, BP 90/56, SPO2 97% ? ?Ventilator Settings AC VC, tidal volume 480, FiO2 20%, rate 16, PEEP 5 ? ?General: Comfortable at this time ?Neck: supple ?Cardiovascular: no malignant arrhythmias ?Respiratory: Bilaterally clear ?Skin: no rash seen on limited exam ?Musculoskeletal: No gross abnormality ?Psychiatric:unable to assess ?Neurologic:no involuntary movements   ?   ?   ?Lab Data:  ? ?Basic Metabolic Panel: ?Recent Labs  ?Lab 07/02/21 ?0423 07/02/21 ?1816 07/03/21 ?3734 07/04/21 ?2876 07/05/21 ?0408  ?NA 138  --  140 142 144  ?K 6.0* 5.6* 5.1 4.3 4.3  ?CL 106  --  105 105 103  ?CO2 26  --  28 31 30   ?GLUCOSE 135*  --  138* 133* 128*  ?BUN 62*  --  65* 63* 63*  ?CREATININE 0.74  --  0.72 0.74 0.72  ?CALCIUM 8.9  --  9.0 8.8* 8.7*  ? ? ?ABG: ?No results for input(s): PHART, PCO2ART, PO2ART, HCO3, O2SAT in the last 168 hours. ? ?Liver Function Tests: ?Recent Labs  ?Lab 07/02/21 ?0423 07/05/21 ?0408  ?AST 15 19  ?ALT 13 16  ?ALKPHOS 70 79  ?BILITOT 0.4 0.8  ?PROT 4.8* 5.8*  ?ALBUMIN <1.5* 1.6*  ? ?No results for input(s): LIPASE, AMYLASE in the last 168 hours. ?No results for input(s): AMMONIA in the last 168 hours. ? ?CBC: ?Recent Labs  ?Lab 07/02/21 ?0423  07/03/21 ?0444 07/05/21 ?0408  ?WBC 10.8* 15.4* 8.6  ?NEUTROABS  --   --  6.6  ?HGB 7.0* 7.6* 7.5*  ?HCT 23.6* 25.3* 24.8*  ?MCV 101.3* 99.2 100.0  ?PLT 274 330 288  ? ? ?Cardiac Enzymes: ?No results for input(s): CKTOTAL, CKMB, CKMBINDEX, TROPONINI in the last 168 hours. ? ?BNP (last 3 results) ?No results for input(s): BNP in the last 8760 hours. ? ?ProBNP (last 3 results) ?No results for input(s): PROBNP in the last 8760 hours. ? ?Radiological Exams: ?No results found. ? ?Assessment/Plan ?Active Problems: ?  Acute on chronic respiratory failure with hypoxia (HCC) ?  CAP (community acquired pneumonia) ?  COPD mixed type (HCC) ?  Chronic diastolic CHF (congestive heart failure) (HCC) ?  Paroxysmal atrial fibrillation (HCC) ? ?Acute on chronic respiratory failure hypoxia -she completed 16 hours of ATC yesterday and will move forward with 20 hours of ATC today. Continue with pulmonary toilet. ?Community-acquired pneumonia -has been treated with antibiotics, we will continue to follow along closely. ?COPD -severe disease , continue with supportive care. ?Chronic diastolic heart failure- appears to be compensated, continue monitoring fluid status. ?Paroxysmal atrial fibrillation- currently rate is controlled, continue with medical management. ?Right pleural effusion-chest tube remains in place to wall suction, moderate amount of drainage still present.  IR continues to follow. ? ? ?I have personally seen and evaluated the  patient, evaluated laboratory and imaging results, formulated the assessment and plan and placed orders. ?The Patient requires high complexity decision making with multiple systems involvement.  ?Rounds were done with the Respiratory Therapy Director and Staff therapists and discussed with nursing staff also. ? ?Yevonne Pax, MD FCCP ?Pulmonary Critical Care Medicine ?Sleep Medicine  ?

## 2021-07-06 NOTE — Progress Notes (Signed)
? ? ?Referring Physician(s): ?Manson Passey ? ?Supervising Physician: Gilmer Mor ? ?Patient Status:  [facility] - In-pt ? ?Chief Complaint: ? ?F/U Chest tube ? ?Brief History: ? ?71 y.o. female with hx COPD and acute on chronic respiratory failure, CAP on abx, who developed right PTX after attempted thoracentesis on 4/25.  ?  ?IR interventions: ?- 4/25: attempted right thoracentesis  ?- 4/25: right 10 Fr chest tube placement by Dr. Milford Cage ?- 4/27: CT chest showed persistent PTX ?- 4/28: right chest tube exchanged and upsized to 14 Fr by Dr. Archer Asa ? ?IR PA note reviewed from 5/3 -  ? ?Chest tube still to water seal. ? ?Repeat CXR done today showed = ?Very difficult by chest x-ray to determine degree of residual ?loculated pneumothorax remaining. No significant pneumothorax is ?felt to remain. ? ?Discussed with Dr. Fredia Sorrow since he read the X-ray. Not much change and a source for infection. ? ?He felt it was ok to remove the chest tube.  ? ?Allergies: ?Amlodipine ? ?Medications: ?Prior to Admission medications   ?Not on File  ? ? ? ?Vital Signs: ?BP 135/77 (BP Location: Left Leg)   Pulse 80   Resp (!) 23  ? ?Physical Exam ?Awake ?Trach/vent ?Chest tube in place ?Clear drainage in the Pleurevac. ?Chest tube removed without difficult ?There was a fibrinous clot in the tubing which likely minimized effect of the chest tube. ? ?Imaging: ?DG Chest 1 View ? ?Result Date: 07/06/2021 ?CLINICAL DATA:  Pneumothorax and status post right pigtail chest tube placement including exchange and up sizing on 06/25/2021. EXAM: CHEST  1 VIEW COMPARISON:  07/02/2021 FINDINGS: Stable tracheostomy, right-sided pigtail thoracostomy tube and pacemaker. It is again difficult to determine with degree of residual loculated pneumothorax remains based on chest x-ray due to the extensive underlying lung disease, pulmonary opacity and potential residual visualization of a pleural reflection. No significant pneumothorax remains. Accurate delineation of  residual pneumothorax would only be possible by chest CT. Stable underlying chronic lung disease otherwise. No significant pleural effusions. Stable cardiac enlargement. IMPRESSION: Very difficult by chest x-ray to determine degree of residual loculated pneumothorax remaining. No significant pneumothorax is felt to remain. Accurate delineation of residual pneumothorax would only be possible by chest CT. Electronically Signed   By: Irish Lack M.D.   On: 07/06/2021 11:49   ? ?Labs: ? ?CBC: ?Recent Labs  ?  06/29/21 ?0455 07/02/21 ?0423 07/03/21 ?0444 07/05/21 ?0408  ?WBC 15.6* 10.8* 15.4* 8.6  ?HGB 8.5* 7.0* 7.6* 7.5*  ?HCT 28.0* 23.6* 25.3* 24.8*  ?PLT 282 274 330 288  ? ? ?COAGS: ?Recent Labs  ?  06-28-21 ?0446  ?INR 1.4*  ? ? ?BMP: ?Recent Labs  ?  07/02/21 ?0423 07/02/21 ?1816 07/03/21 ?9833 07/04/21 ?8250 07/05/21 ?0408  ?NA 138  --  140 142 144  ?K 6.0* 5.6* 5.1 4.3 4.3  ?CL 106  --  105 105 103  ?CO2 26  --  28 31 30   ?GLUCOSE 135*  --  138* 133* 128*  ?BUN 62*  --  65* 63* 63*  ?CALCIUM 8.9  --  9.0 8.8* 8.7*  ?CREATININE 0.74  --  0.72 0.74 0.72  ?GFRNONAA >60  --  >60 >60 >60  ? ? ?LIVER FUNCTION TESTS: ?Recent Labs  ?  2021-06-28 ?0446 06/21/21 ?0912 07/02/21 ?0423 07/05/21 ?0408  ?BILITOT 0.6 0.3 0.4 0.8  ?AST 16 15 15 19   ?ALT 17 16 13 16   ?ALKPHOS 87 78 70 79  ?PROT 5.5* 5.6* 4.8* 5.8*  ?  ALBUMIN 1.7* 1.5* <1.5* 1.6*  ? ? ?Assessment and Plan: ? ?Pneumothorax after attempted thoracentesis. ? ?Chest tube placed on 4/25 then upsized on 4/28. ? ?CXR reviewed by Dr. Fredia Sorrow. Ok to remove. ? ?Removed without difficulty. There was a large fibrinous clot seen in the lumen of the pigtail drain catheter. ? ?Will repeat CXR since chest tube removed. ? ?Electronically Signed: ?Gwynneth Macleod, PA-C ?07/06/2021, 1:50 PM ? ? ? ?I spent a total of 25 Minutes at the the patient's bedside AND on the patient's hospital floor or unit, greater than 50% of which was counseling/coordinating care for chest tube  removal. ? ? ? ? ? ?

## 2021-07-07 ENCOUNTER — Encounter (HOSPITAL_BASED_OUTPATIENT_CLINIC_OR_DEPARTMENT_OTHER): Payer: Medicare PPO

## 2021-07-07 DIAGNOSIS — M7989 Other specified soft tissue disorders: Secondary | ICD-10-CM

## 2021-07-07 DIAGNOSIS — J449 Chronic obstructive pulmonary disease, unspecified: Secondary | ICD-10-CM | POA: Diagnosis not present

## 2021-07-07 DIAGNOSIS — I5032 Chronic diastolic (congestive) heart failure: Secondary | ICD-10-CM | POA: Diagnosis not present

## 2021-07-07 DIAGNOSIS — J189 Pneumonia, unspecified organism: Secondary | ICD-10-CM | POA: Diagnosis not present

## 2021-07-07 DIAGNOSIS — J9621 Acute and chronic respiratory failure with hypoxia: Secondary | ICD-10-CM | POA: Diagnosis not present

## 2021-07-07 NOTE — Progress Notes (Addendum)
Pulmonary Critical Care Medicine ?[facility] [place] ?  ?PULMONARY CRITICAL CARE SERVICE ? ?PROGRESS NOTE ? ? ? ? ?Sherry Mcmahon  ?QMV:[id number]  ?DOB: [birth year]  ? ?DOA: 06/20/21 ? ?Referring Physician: Luna Kitchens, MD ? ?HPI: Sherry Mcmahon is a 71 y.o. female being followed for ventilator/airway/oxygen weaning Acute on Chronic Respiratory Failure. Patient seen lying in bed, remains on full support at this time. IR did remove her chest tube yesterday afternoon. She unfortunately was unsuccessful with her goal of 20 hours ATC yesterday. No acute distress, no acute overnight events.  ? ?Medications: ?Reviewed on Rounds ? ?Physical Exam: ? ?Vitals: temp 97.1, pulse 80, respirations 32, bp 90/55, sp02 98% ? ?Ventilator Settings AC VC, tidal volume 480, FiO2 20%, rate 16, PEEP 5 ? ?General: Comfortable at this time ?Neck: supple ?Cardiovascular: no malignant arrhythmias ?Respiratory: Bilaterally clear ?Skin: no rash seen on limited exam ?Musculoskeletal: No gross abnormality ?Psychiatric:unable to assess ?Neurologic:no involuntary movements   ?   ?   ?Lab Data:  ? ?Basic Metabolic Panel: ?Recent Labs  ?Lab 07/02/21 ?0423 07/02/21 ?1816 07/03/21 ?2841 07/04/21 ?3244 07/05/21 ?0408  ?NA 138  --  140 142 144  ?K 6.0* 5.6* 5.1 4.3 4.3  ?CL 106  --  105 105 103  ?CO2 26  --  28 31 30   ?GLUCOSE 135*  --  138* 133* 128*  ?BUN 62*  --  65* 63* 63*  ?CREATININE 0.74  --  0.72 0.74 0.72  ?CALCIUM 8.9  --  9.0 8.8* 8.7*  ? ? ?ABG: ?No results for input(s): PHART, PCO2ART, PO2ART, HCO3, O2SAT in the last 168 hours. ? ?Liver Function Tests: ?Recent Labs  ?Lab 07/02/21 ?0423 07/05/21 ?0408  ?AST 15 19  ?ALT 13 16  ?ALKPHOS 70 79  ?BILITOT 0.4 0.8  ?PROT 4.8* 5.8*  ?ALBUMIN <1.5* 1.6*  ? ?No results for input(s): LIPASE, AMYLASE in the last 168 hours. ?No results for input(s): AMMONIA in the last 168 hours. ? ?CBC: ?Recent Labs  ?Lab 07/02/21 ?0423 07/03/21 ?0444 07/05/21 ?0408  ?WBC 10.8* 15.4* 8.6  ?NEUTROABS  --   --   6.6  ?HGB 7.0* 7.6* 7.5*  ?HCT 23.6* 25.3* 24.8*  ?MCV 101.3* 99.2 100.0  ?PLT 274 330 288  ? ? ?Cardiac Enzymes: ?No results for input(s): CKTOTAL, CKMB, CKMBINDEX, TROPONINI in the last 168 hours. ? ?BNP (last 3 results) ?No results for input(s): BNP in the last 8760 hours. ? ?ProBNP (last 3 results) ?No results for input(s): PROBNP in the last 8760 hours. ? ?Radiological Exams: ?DG Chest 1 View ? ?Result Date: 07/06/2021 ?CLINICAL DATA:  Pneumothorax. EXAM: CHEST  1 VIEW COMPARISON:  Radiograph earlier today.  CT 06/24/2021 FINDINGS: The patient's chin obscures assessment of the right lung apex, particularly for pneumothorax. Allowing for this limitation, no pneumothorax is seen. The previous right-sided pigtail catheter has been removed. Tracheostomy tube tip remains at the thoracic inlet. Left-sided pacemaker in place. Stable right lung airspace disease. Stable heart size and mediastinal contours. IMPRESSION: 1. No visualized pneumothorax, status post right pigtail catheter removal. The patient's chin obscures the right lung apex. 2. Stable right lung airspace disease. 3. Tracheostomy tube tip remains at the thoracic inlet. Electronically Signed   By: 06/26/2021 M.D.   On: 07/06/2021 16:54  ? ?DG Chest 1 View ? ?Result Date: 07/06/2021 ?CLINICAL DATA:  Pneumothorax and status post right pigtail chest tube placement including exchange and up sizing on 06/25/2021. EXAM: CHEST  1 VIEW COMPARISON:  07/02/2021 FINDINGS: Stable tracheostomy, right-sided pigtail thoracostomy tube and pacemaker. It is again difficult to determine with degree of residual loculated pneumothorax remains based on chest x-ray due to the extensive underlying lung disease, pulmonary opacity and potential residual visualization of a pleural reflection. No significant pneumothorax remains. Accurate delineation of residual pneumothorax would only be possible by chest CT. Stable underlying chronic lung disease otherwise. No significant pleural  effusions. Stable cardiac enlargement. IMPRESSION: Very difficult by chest x-ray to determine degree of residual loculated pneumothorax remaining. No significant pneumothorax is felt to remain. Accurate delineation of residual pneumothorax would only be possible by chest CT. Electronically Signed   By: Irish LackGlenn  Yamagata M.D.   On: 07/06/2021 11:49  ? ?VAS US UPPER EXTREMITY VENOUS DUPLEX ? ?Result Date: 07/07/2021 ?UPPER VENOUS STUDY  Patient Name:  Sherry Mcmahon  Date of Exam:   07/07/2021 Medical Rec #: [id number]    Accession #:    [id number] Date of Birth: 06/11/1950    Patient Gender: F Patient Age:   [age] years [facility]am [facility] Procedure:      VAS US UPPER EXTREMITY VENOUS DUPLEX Referring Phys: Marquette OldBINNA ORIAKU --------------------------------------------------------------------------------  Indications: Swelling Limitations: Bandages (multiple), line and contracted extremity. Comparison Study: No previous exams Performing Technologist: Jody Hill RVT, RDMS  Examination Guidelines: A complete evaluation includes B-mode imaging, spectral Doppler, color Doppler, and power Doppler as needed of all accessible portions of each vessel. Bilateral testing is considered an integral part of a complete examination. Limited examinations for reoccurring indications may be performed as noted.  Right Findings: +----------+------------+---------+-----------+----------+-------+ RIGHT     CompressiblePhasicitySpontaneousPropertiesSummary +----------+------------+---------+-----------+----------+-------+ Subclavian    Full       Yes       Yes                      +----------+------------+---------+-----------+----------+-------+  Left Findings: +----------+------------+---------+-----------+----------+--------------------+ LEFT      CompressiblePhasicitySpontaneousProperties      Summary        +----------+------------+---------+-----------+----------+--------------------+ IJV           Full        Yes       Yes                                   +----------+------------+---------+-----------+----------+--------------------+ Subclavian    Full       Yes       Yes                                   +----------+------------+---------+-----------+----------+--------------------+ Axillary      None       No        No                      Acute         +----------+------------+---------+-----------+----------+--------------------+ Brachial      Full       Yes       Yes                                   +----------+------------+---------+-----------+----------+--------------------+ Radial                   Yes       Yes  Patent by                                                               color/patent     +----------+------------+---------+-----------+----------+--------------------+ Ulnar                                                  Not visualized    +----------+------------+---------+-----------+----------+--------------------+ Cephalic      Full                                                       +----------+------------+---------+-----------+----------+--------------------+ Basilic       None       No        No                      Acute         +----------+------------+---------+-----------+----------+--------------------+ Only proximal portion of brachial and basilic veins visualized due to multiple bandages to extremity. Ulnar veins not visualized due to multiple bandages and overlying edema.  Summary:  Right: No evidence of thrombosis in the subclavian.  Left: Findings consistent with acute deep vein thrombosis involving the left axillary vein. Findings consistent with acute superficial vein thrombosis involving the left basilic vein. However, unable to visualize the ulnar veins and only very proximal portion of brachial vein visualized.  *See table(s) above for measurements and observations.    Preliminary     ? ?Assessment/Plan ?Active Problems: ?  Acute on chronic respiratory failure with hypoxia (HCC) ?  CAP (community acquired pneumonia) ?  COPD mixed type (HCC) ?  Chronic diastolic CHF (congestive heart failure) (HCC) ?  Paroxysmal atrial fibrillation (HCC) ? ?Acute on chronic respiratory failure hypo

## 2021-07-07 NOTE — Progress Notes (Signed)
LUE extremity venous duplex has been completed.  Preliminary findings given to Autumn, Charity fundraiser. ? ? ?Results can be found under chart review under CV PROC. ?07/07/2021 1:02 PM ?Neriah Brott RVT, RDMS ? ?

## 2021-07-08 DIAGNOSIS — J449 Chronic obstructive pulmonary disease, unspecified: Secondary | ICD-10-CM | POA: Diagnosis not present

## 2021-07-08 DIAGNOSIS — J9621 Acute and chronic respiratory failure with hypoxia: Secondary | ICD-10-CM | POA: Diagnosis not present

## 2021-07-08 DIAGNOSIS — I5032 Chronic diastolic (congestive) heart failure: Secondary | ICD-10-CM | POA: Diagnosis not present

## 2021-07-08 DIAGNOSIS — J189 Pneumonia, unspecified organism: Secondary | ICD-10-CM | POA: Diagnosis not present

## 2021-07-08 NOTE — Progress Notes (Signed)
Pulmonary Critical Care Medicine ?[facility] [place] ?  ?PULMONARY CRITICAL CARE SERVICE ? ?PROGRESS NOTE ? ? ? ? ?Sherry Mcmahon  ?BTD:[id number]  ?DOB: [birth year]  ? ?DOA: 06-25-21 ? ?Referring Physician: Luna Kitchens, MD ? ?HPI: Sherry Mcmahon is a 71 y.o. female being followed for ventilator/airway/oxygen weaning Acute on Chronic Respiratory Failure.  Reportedly having some mucous plugs some blood-tinged also noted ? ?Medications: ?Reviewed on Rounds ? ?Physical Exam: ? ?Vitals: Temperature is 98.0 pulse of 86 respiratory 27 blood pressure 100/52 saturations 97% ? ?Ventilator Settings assist-control FiO2 is 28% tidal volume 400 PEEP 5 ? ?General: Comfortable at this time ?Neck: supple ?Cardiovascular: no malignant arrhythmias ?Respiratory: Coarse rhonchi are noted bilaterally ?Skin: no rash seen on limited exam ?Musculoskeletal: No gross abnormality ?Psychiatric:unable to assess ?Neurologic:no involuntary movements   ?   ?   ?Lab Data:  ? ?Basic Metabolic Panel: ?Recent Labs  ?Lab 07/02/21 ?0423 07/02/21 ?1816 07/03/21 ?1062 07/04/21 ?6948 07/05/21 ?0408  ?NA 138  --  140 142 144  ?K 6.0* 5.6* 5.1 4.3 4.3  ?CL 106  --  105 105 103  ?CO2 26  --  28 31 30   ?GLUCOSE 135*  --  138* 133* 128*  ?BUN 62*  --  65* 63* 63*  ?CREATININE 0.74  --  0.72 0.74 0.72  ?CALCIUM 8.9  --  9.0 8.8* 8.7*  ? ? ?ABG: ?No results for input(s): PHART, PCO2ART, PO2ART, HCO3, O2SAT in the last 168 hours. ? ?Liver Function Tests: ?Recent Labs  ?Lab 07/02/21 ?0423 07/05/21 ?0408  ?AST 15 19  ?ALT 13 16  ?ALKPHOS 70 79  ?BILITOT 0.4 0.8  ?PROT 4.8* 5.8*  ?ALBUMIN <1.5* 1.6*  ? ?No results for input(s): LIPASE, AMYLASE in the last 168 hours. ?No results for input(s): AMMONIA in the last 168 hours. ? ?CBC: ?Recent Labs  ?Lab 07/02/21 ?0423 07/03/21 ?0444 07/05/21 ?0408  ?WBC 10.8* 15.4* 8.6  ?NEUTROABS  --   --  6.6  ?HGB 7.0* 7.6* 7.5*  ?HCT 23.6* 25.3* 24.8*  ?MCV 101.3* 99.2 100.0  ?PLT 274 330 288  ? ? ?Cardiac Enzymes: ?No results  for input(s): CKTOTAL, CKMB, CKMBINDEX, TROPONINI in the last 168 hours. ? ?BNP (last 3 results) ?No results for input(s): BNP in the last 8760 hours. ? ?ProBNP (last 3 results) ?No results for input(s): PROBNP in the last 8760 hours. ? ?Radiological Exams: ?DG Chest 1 View ? ?Result Date: 07/06/2021 ?CLINICAL DATA:  Pneumothorax. EXAM: CHEST  1 VIEW COMPARISON:  Radiograph earlier today.  CT 06/24/2021 FINDINGS: The patient's chin obscures assessment of the right lung apex, particularly for pneumothorax. Allowing for this limitation, no pneumothorax is seen. The previous right-sided pigtail catheter has been removed. Tracheostomy tube tip remains at the thoracic inlet. Left-sided pacemaker in place. Stable right lung airspace disease. Stable heart size and mediastinal contours. IMPRESSION: 1. No visualized pneumothorax, status post right pigtail catheter removal. The patient's chin obscures the right lung apex. 2. Stable right lung airspace disease. 3. Tracheostomy tube tip remains at the thoracic inlet. Electronically Signed   By: 06/26/2021 M.D.   On: 07/06/2021 16:54  ? ?DG Chest 1 View ? ?Result Date: 07/06/2021 ?CLINICAL DATA:  Pneumothorax and status post right pigtail chest tube placement including exchange and up sizing on 06/25/2021. EXAM: CHEST  1 VIEW COMPARISON:  07/02/2021 FINDINGS: Stable tracheostomy, right-sided pigtail thoracostomy tube and pacemaker. It is again difficult to determine with degree of residual loculated pneumothorax remains based on  chest x-ray due to the extensive underlying lung disease, pulmonary opacity and potential residual visualization of a pleural reflection. No significant pneumothorax remains. Accurate delineation of residual pneumothorax would only be possible by chest CT. Stable underlying chronic lung disease otherwise. No significant pleural effusions. Stable cardiac enlargement. IMPRESSION: Very difficult by chest x-ray to determine degree of residual loculated  pneumothorax remaining. No significant pneumothorax is felt to remain. Accurate delineation of residual pneumothorax would only be possible by chest CT. Electronically Signed   By: Irish LackGlenn  Yamagata M.D.   On: 07/06/2021 11:49  ? ?VAS US UPPER EXTREMITY VENOUS DUPLEX ? ?Result Date: 07/07/2021 ?UPPER VENOUS STUDY  Patient Name:  Sherry Mcmahon  Date of Exam:   07/07/2021 Medical Rec #: [id number]    Accession #:    [id number] Date of Birth: 1950-07-04    Patient Gender: F Patient Age:   89 years [facility]am [facility] Procedure:      VAS US UPPER EXTREMITY VENOUS DUPLEX Referring Phys: Marquette OldBINNA ORIAKU --------------------------------------------------------------------------------  Indications: Swelling Limitations: Bandages (multiple), line and contracted extremity. Comparison Study: No previous exams Performing Technologist: Jody Hill RVT, RDMS  Examination Guidelines: A complete evaluation includes B-mode imaging, spectral Doppler, color Doppler, and power Doppler as needed of all accessible portions of each vessel. Bilateral testing is considered an integral part of a complete examination. Limited examinations for reoccurring indications may be performed as noted.  Right Findings: +----------+------------+---------+-----------+----------+-------+ RIGHT     CompressiblePhasicitySpontaneousPropertiesSummary +----------+------------+---------+-----------+----------+-------+ Subclavian    Full       Yes       Yes                      +----------+------------+---------+-----------+----------+-------+  Left Findings: +----------+------------+---------+-----------+----------+--------------------+ LEFT      CompressiblePhasicitySpontaneousProperties      Summary        +----------+------------+---------+-----------+----------+--------------------+ IJV           Full       Yes       Yes                                    +----------+------------+---------+-----------+----------+--------------------+ Subclavian    Full       Yes       Yes                                   +----------+------------+---------+-----------+----------+--------------------+ Axillary      None       No        No                      Acute         +----------+------------+---------+-----------+----------+--------------------+ Brachial      Full       Yes       Yes                                   +----------+------------+---------+-----------+----------+--------------------+ Radial                   Yes       Yes                   Patent by  color/patent     +----------+------------+---------+-----------+----------+--------------------+ Ulnar                                                  Not visualized    +----------+------------+---------+-----------+----------+--------------------+ Cephalic      Full                                                       +----------+------------+---------+-----------+----------+--------------------+ Basilic       None       No        No                      Acute         +----------+------------+---------+-----------+----------+--------------------+ Only proximal portion of brachial and basilic veins visualized due to multiple bandages to extremity. Ulnar veins not visualized due to multiple bandages and overlying edema.  Summary:  Right: No evidence of thrombosis in the subclavian.  Left: Findings consistent with acute deep vein thrombosis involving the left axillary vein. Findings consistent with acute superficial vein thrombosis involving the left basilic vein. However, unable to visualize the ulnar veins and only very proximal portion of brachial vein visualized.  *See table(s) above for measurements and observations.  Diagnosing physician: Gerarda Fraction Electronically signed by Gerarda Fraction on  07/07/2021 at 9:16:48 PM.    Final    ? ?Assessment/Plan ?Active Problems: ?  Acute on chronic respiratory failure with hypoxia (HCC) ?  CAP (community acquired pneumonia) ?  COPD mixed type (HCC) ?  Chronic diastolic CHF (congestive heart failure) (HCC) ?  Paroxysmal atrial fibrillation (HCC) ? ? ?Acute on chronic respiratory failure hypoxia plan is going to be t

## 2021-07-09 ENCOUNTER — Other Ambulatory Visit (HOSPITAL_COMMUNITY): Payer: Medicare PPO

## 2021-07-09 DIAGNOSIS — J189 Pneumonia, unspecified organism: Secondary | ICD-10-CM | POA: Diagnosis not present

## 2021-07-09 DIAGNOSIS — J9621 Acute and chronic respiratory failure with hypoxia: Secondary | ICD-10-CM | POA: Diagnosis not present

## 2021-07-09 DIAGNOSIS — I5032 Chronic diastolic (congestive) heart failure: Secondary | ICD-10-CM | POA: Diagnosis not present

## 2021-07-09 DIAGNOSIS — J449 Chronic obstructive pulmonary disease, unspecified: Secondary | ICD-10-CM | POA: Diagnosis not present

## 2021-07-09 LAB — CBC
HCT: 19.5 % — ABNORMAL LOW (ref 36.0–46.0)
Hemoglobin: 6.1 g/dL — CL (ref 12.0–15.0)
MCH: 30 pg (ref 26.0–34.0)
MCHC: 31.3 g/dL (ref 30.0–36.0)
MCV: 96.1 fL (ref 80.0–100.0)
Platelets: 233 10*3/uL (ref 150–400)
RBC: 2.03 MIL/uL — ABNORMAL LOW (ref 3.87–5.11)
RDW: 16.3 % — ABNORMAL HIGH (ref 11.5–15.5)
WBC: 11.3 10*3/uL — ABNORMAL HIGH (ref 4.0–10.5)
nRBC: 0 % (ref 0.0–0.2)

## 2021-07-09 LAB — BASIC METABOLIC PANEL
Anion gap: 8 (ref 5–15)
Anion gap: 14 (ref 5–15)
BUN: 138 mg/dL — ABNORMAL HIGH (ref 8–23)
BUN: 142 mg/dL — ABNORMAL HIGH (ref 8–23)
CO2: 29 mmol/L (ref 22–32)
CO2: 26 mmol/L (ref 22–32)
Calcium: 8.6 mg/dL — ABNORMAL LOW (ref 8.9–10.3)
Calcium: 8.6 mg/dL — ABNORMAL LOW (ref 8.9–10.3)
Chloride: 104 mmol/L (ref 98–111)
Chloride: 103 mmol/L (ref 98–111)
Creatinine, Ser: 3.12 mg/dL — ABNORMAL HIGH (ref 0.44–1.00)
Creatinine, Ser: 3.21 mg/dL — ABNORMAL HIGH (ref 0.44–1.00)
GFR, Estimated: 16 mL/min — ABNORMAL LOW (ref >60)
GFR, Estimated: 15 mL/min — ABNORMAL LOW (ref >60)
Glucose, Bld: 168 mg/dL — ABNORMAL HIGH (ref 70–99)
Glucose, Bld: 145 mg/dL — ABNORMAL HIGH (ref 70–99)
Potassium: 5.9 mmol/L — ABNORMAL HIGH (ref 3.5–5.1)
Potassium: 6.2 mmol/L — ABNORMAL HIGH (ref 3.5–5.1)
Sodium: 141 mmol/L (ref 135–145)
Sodium: 143 mmol/L (ref 135–145)

## 2021-07-09 LAB — PREPARE RBC (CROSSMATCH)

## 2021-07-09 LAB — ABO/RH: ABO/RH(D): O POS

## 2021-07-09 LAB — MAGNESIUM: Magnesium: 2.2 mg/dL (ref 1.7–2.4)

## 2021-07-09 NOTE — Progress Notes (Signed)
Pulmonary Critical Care Medicine ?[place] ?  ?PULMONARY CRITICAL CARE SERVICE ? ?PROGRESS NOTE ? ? ? ? ?Sherry Mcmahon  ?[id number]  ?DOB: 1950-05-19  ? ?DOA: 05/31/2021 ? ?Referring Physician: Satira Sark, MD ? ?HPI: Sherry Mcmahon is a 71 y.o. female being followed for ventilator/airway/oxygen weaning Acute on Chronic Respiratory Failure.  Patient is on full support assist control mode currently on 28% FiO2 ? ?Medications: ?Reviewed on Rounds ? ?Physical Exam: ? ?Vitals: Temperature 96.9 pulse of 88 respiratory rate 22 blood pressure 99/67 saturations 93% ? ?Ventilator Settings assist-control FiO2 28% tidal volume 400 PEEP of 5 ? ?General: Comfortable at this time ?Neck: supple ?Cardiovascular: no malignant arrhythmias ?Respiratory: Coarse rhonchi expansion is equal ?Skin: no rash seen on limited exam ?Musculoskeletal: No gross abnormality ?Psychiatric:unable to assess ?Neurologic:no involuntary movements   ?   ?   ?Lab Data:  ? ?Basic Metabolic Panel: ?Recent Labs  ?Lab 07/02/21 ?1816 07/03/21 ?0444 07/04/21 ?[id number] 07/05/21 ?0408 07/09/21 ?0532  ?NA  --  140 142 144 141  ?K 5.6* 5.1 4.3 4.3 5.9*  ?CL  --  105 105 103 104  ?CO2  --  28 31 30 29   ?GLUCOSE  --  138* 133* 128* 168*  ?BUN  --  65* 63* 63* 138*  ?CREATININE  --  0.72 0.74 0.72 3.12*  ?CALCIUM  --  9.0 8.8* 8.7* 8.6*  ?MG  --   --   --   --  2.2  ? ? ?ABG: ?No results for input(s): PHART, PCO2ART, PO2ART, HCO3, O2SAT in the last 168 hours. ? ?Liver Function Tests: ?Recent Labs  ?Lab 07/05/21 ?0408  ?AST 19  ?ALT 16  ?ALKPHOS 79  ?BILITOT 0.8  ?PROT 5.8*  ?ALBUMIN 1.6*  ? ?No results for input(s): LIPASE, AMYLASE in the last 168 hours. ?No results for input(s): AMMONIA in the last 168 hours. ? ?CBC: ?Recent Labs  ?Lab 07/03/21 ?0444 07/05/21 ?0408 07/09/21 ?0532  ?WBC 15.4* 8.6 11.3*  ?NEUTROABS  --  6.6  --   ?HGB 7.6* 7.5* 6.1*  ?HCT 25.3* 24.8* 19.5*  ?MCV 99.2 100.0 96.1  ?PLT 330 288 233  ? ? ?Cardiac Enzymes: ?No results for  input(s): CKTOTAL, CKMB, CKMBINDEX, TROPONINI in the last 168 hours. ? ?BNP (last 3 results) ?No results for input(s): BNP in the last 8760 hours. ? ?ProBNP (last 3 results) ?No results for input(s): PROBNP in the last 8760 hours. ? ?Radiological Exams: ?VAS [place] UPPER EXTREMITY VENOUS DUPLEX ? ?Result Date: 07/07/2021 ?UPPER VENOUS STUDY  Patient Name:  Sherry Mcmahon  Date of Exam:   07/07/2021 Medical Rec #: [id number]    Accession #:    [id number] Date of Birth: 28-Jun-1950    Patient Gender: F Patient Age:   49 years Exam Location:  [facility] Procedure:      VAS [place] UPPER EXTREMITY VENOUS DUPLEX Referring Phys: Verlin Grills --------------------------------------------------------------------------------  Indications: Swelling Limitations: Bandages (multiple), line and contracted extremity. Comparison Study: No previous exams Performing Technologist: Jody Hill RVT, RDMS  Examination Guidelines: A complete evaluation includes B-mode imaging, spectral Doppler, color Doppler, and power Doppler as needed of all accessible portions of each vessel. Bilateral testing is considered an integral part of a complete examination. Limited examinations for reoccurring indications may be performed as noted.  Right Findings: +----------+------------+---------+-----------+----------+-------+ RIGHT     CompressiblePhasicitySpontaneousPropertiesSummary +----------+------------+---------+-----------+----------+-------+ Subclavian    Full       Yes       Yes                      +----------+------------+---------+-----------+----------+-------+  Left Findings: +----------+------------+---------+-----------+----------+--------------------+ LEFT      CompressiblePhasicitySpontaneousProperties      Summary        +----------+------------+---------+-----------+----------+--------------------+ IJV           Full       Yes       Yes                                    +----------+------------+---------+-----------+----------+--------------------+ Subclavian    Full       Yes       Yes                                   +----------+------------+---------+-----------+----------+--------------------+ Axillary      None       No        No                      Acute         +----------+------------+---------+-----------+----------+--------------------+ Brachial      Full       Yes       Yes                                   +----------+------------+---------+-----------+----------+--------------------+ Radial                   Yes       Yes                   Patent by                                                               color/patent     +----------+------------+---------+-----------+----------+--------------------+ Ulnar                                                  Not visualized    +----------+------------+---------+-----------+----------+--------------------+ Cephalic      Full                                                       +----------+------------+---------+-----------+----------+--------------------+ Basilic       None       No        No                      Acute         +----------+------------+---------+-----------+----------+--------------------+ Only proximal portion of brachial and basilic veins visualized due to multiple bandages to extremity. Ulnar veins not visualized due to multiple bandages and overlying edema.  Summary:  Right: No evidence of thrombosis in the subclavian.  Left: Findings consistent with acute deep vein thrombosis involving the left axillary vein. Findings consistent with acute superficial vein thrombosis involving the left basilic vein. However,  unable to visualize the ulnar veins and only very proximal portion of brachial vein visualized.  *See table(s) above for measurements and observations.  Diagnosing physician: Orlie Pollen Electronically signed by Orlie Pollen on  07/07/2021 at 9:16:48 PM.    Final    ? ?Assessment/Plan ?Active Problems: ?  Acute on chronic respiratory failure with hypoxia (HCC) ?  CAP (community acquired pneumonia) ?  COPD mixed type ([place]) ?  Chronic diastolic CHF (congestive heart failure) ([place]) ?  Paroxysmal atrial fibrillation (HCC) ? ? ?Acute on chronic respiratory failure with hypoxia we will continue with assist control mode currently on 28% FiO2 saturations are good ?Community-acquired pneumonia no change we will continue to follow along closely ?Severe COPD medical management ?Chronic diastolic heart failure compensated ?Paroxysmal atrial fibrillation rate is controlled ? ? ?I have personally seen and evaluated the patient, evaluated laboratory and imaging results, formulated the assessment and plan and placed orders. ?The Patient requires high complexity decision making with multiple systems involvement.  ?Rounds were done with the Respiratory Therapy Director and Staff therapists and discussed with nursing staff also. ? ?Allyne Gee, MD FCCP ?Pulmonary Critical Care Medicine ?Sleep Medicine ? ?

## 2021-07-10 ENCOUNTER — Other Ambulatory Visit (HOSPITAL_COMMUNITY): Payer: Medicare PPO

## 2021-07-10 DIAGNOSIS — J449 Chronic obstructive pulmonary disease, unspecified: Secondary | ICD-10-CM | POA: Diagnosis not present

## 2021-07-10 DIAGNOSIS — J189 Pneumonia, unspecified organism: Secondary | ICD-10-CM | POA: Diagnosis not present

## 2021-07-10 DIAGNOSIS — I5032 Chronic diastolic (congestive) heart failure: Secondary | ICD-10-CM | POA: Diagnosis not present

## 2021-07-10 DIAGNOSIS — J9621 Acute and chronic respiratory failure with hypoxia: Secondary | ICD-10-CM | POA: Diagnosis not present

## 2021-07-10 LAB — BPAM RBC
Blood Product Expiration Date: 202306062359
ISSUE DATE / TIME: 202305121322
Unit Type and Rh: 5100

## 2021-07-10 LAB — BASIC METABOLIC PANEL
Anion gap: 14 (ref 5–15)
BUN: 145 mg/dL — ABNORMAL HIGH (ref 8–23)
CO2: 27 mmol/L (ref 22–32)
Calcium: 8.6 mg/dL — ABNORMAL LOW (ref 8.9–10.3)
Chloride: 103 mmol/L (ref 98–111)
Creatinine, Ser: 3.55 mg/dL — ABNORMAL HIGH (ref 0.44–1.00)
GFR, Estimated: 13 mL/min — ABNORMAL LOW (ref >60)
Glucose, Bld: 142 mg/dL — ABNORMAL HIGH (ref 70–99)
Potassium: 5.8 mmol/L — ABNORMAL HIGH (ref 3.5–5.1)
Sodium: 144 mmol/L (ref 135–145)

## 2021-07-10 LAB — CBC
HCT: 22.5 % — ABNORMAL LOW (ref 36.0–46.0)
Hemoglobin: 7.1 g/dL — ABNORMAL LOW (ref 12.0–15.0)
MCH: 29.1 pg (ref 26.0–34.0)
MCHC: 31.6 g/dL (ref 30.0–36.0)
MCV: 92.2 fL (ref 80.0–100.0)
Platelets: 209 10*3/uL (ref 150–400)
RBC: 2.44 MIL/uL — ABNORMAL LOW (ref 3.87–5.11)
RDW: 17.7 % — ABNORMAL HIGH (ref 11.5–15.5)
WBC: 10.3 10*3/uL (ref 4.0–10.5)
nRBC: 0 % (ref 0.0–0.2)

## 2021-07-10 LAB — TYPE AND SCREEN
ABO/RH(D): O POS
Antibody Screen: NEGATIVE
Unit division: 0

## 2021-07-10 LAB — POTASSIUM: Potassium: 6.2 mmol/L — ABNORMAL HIGH (ref 3.5–5.1)

## 2021-07-10 NOTE — Progress Notes (Signed)
Pulmonary Critical Care Medicine ?[facility] [place] ?  ?PULMONARY CRITICAL CARE SERVICE ? ?PROGRESS NOTE ? ? ? ? ?Sherry Mcmahon  ?[id number]  ?DOB: [birth year]  ? ?DOA: 06/14/2021 ? ?Referring Physician: Luna Kitchens, MD ? ?HPI: Sherry Mcmahon is a 71 y.o. female being followed for ventilator/airway/oxygen weaning Acute on Chronic Respiratory Failure.  Patient is currently on assist control mode hemoglobin had come down to 6.8 so therefore the patient's weaning will be held also holding off on doing bronchoscopy until transfused and then we will reassess ? ?Medications: ?Reviewed on Rounds ? ?Physical Exam: ? ?Vitals: Temperature 97.1 pulse of 80 respiratory rate 27 blood pressure 102/56 saturations 98% ? ?Ventilator Settings on assist control FiO2 28% tidal volume 400 PEEP of 5 ? ?General: Comfortable at this time ?Neck: supple ?Cardiovascular: no malignant arrhythmias ?Respiratory: Coarse rhonchi expansion is equal ?Skin: no rash seen on limited exam ?Musculoskeletal: No gross abnormality ?Psychiatric:unable to assess ?Neurologic:no involuntary movements   ?   ?   ?Lab Data:  ? ?Basic Metabolic Panel: ?Recent Labs  ?Lab 07/04/21 ?0314 07/05/21 ?0408 07/09/21 ?0532 07/09/21 ?1020 07/10/21 ?1950  ?NA 142 144 141 143 144  ?K 4.3 4.3 5.9* 6.2* 5.8*  ?CL 105 103 104 103 103  ?CO2 31 30 29 26 27   ?GLUCOSE 133* 128* 168* 145* 142*  ?BUN 63* 63* 138* 142* 145*  ?CREATININE 0.74 0.72 3.12* 3.21* 3.55*  ?CALCIUM 8.8* 8.7* 8.6* 8.6* 8.6*  ?MG  --   --  2.2  --   --   ? ? ?ABG: ?No results for input(s): PHART, PCO2ART, PO2ART, HCO3, O2SAT in the last 168 hours. ? ?Liver Function Tests: ?Recent Labs  ?Lab 07/05/21 ?0408  ?AST 19  ?ALT 16  ?ALKPHOS 79  ?BILITOT 0.8  ?PROT 5.8*  ?ALBUMIN 1.6*  ? ?No results for input(s): LIPASE, AMYLASE in the last 168 hours. ?No results for input(s): AMMONIA in the last 168 hours. ? ?CBC: ?Recent Labs  ?Lab 07/05/21 ?0408 07/09/21 ?0532 07/10/21 ?07/12/21  ?WBC 8.6 11.3* 10.3  ?NEUTROABS  6.6  --   --   ?HGB 7.5* 6.1* 7.1*  ?HCT 24.8* 19.5* 22.5*  ?MCV 100.0 96.1 92.2  ?PLT 288 233 209  ? ? ?Cardiac Enzymes: ?No results for input(s): CKTOTAL, CKMB, CKMBINDEX, TROPONINI in the last 168 hours. ? ?BNP (last 3 results) ?No results for input(s): BNP in the last 8760 hours. ? ?ProBNP (last 3 results) ?No results for input(s): PROBNP in the last 8760 hours. ? ?Radiological Exams: ?[id number] RENAL ? ?Result Date: 07/09/2021 ?CLINICAL DATA:  Acute kidney injury EXAM: RENAL / URINARY TRACT ULTRASOUND COMPLETE COMPARISON:  None Available. FINDINGS: Right Kidney: Renal measurements: 10.2 x 5.5 x 5.4 cm = volume: 158 mL. Echogenicity within normal limits. No mass or hydronephrosis visualized. Left Kidney: Renal measurements: 10.5 x 5.1 x 5.9 cm = volume: 165 mL. Echogenicity within normal limits. No mass or hydronephrosis visualized. Bladder: The bladder is empty, limiting evaluation. Other: None. IMPRESSION: Normal renal ultrasound. No etiology seen for the patient's acute kidney injury. Electronically Signed   By: 09/08/2021 M.D.   On: 07/09/2021 18:11   ? ?Assessment/Plan ?Active Problems: ?  Acute on chronic respiratory failure with hypoxia (HCC) ?  CAP (community acquired pneumonia) ?  COPD mixed type (HCC) ?  Chronic diastolic CHF (congestive heart failure) (HCC) ?  Paroxysmal atrial fibrillation (HCC) ? ? ?Acute on chronic respiratory failure with hypoxia we will continue with assist-control FiO2 28% tidal volume  400 PEEP of 5 ?Community-acquired pneumonia no change continue to follow along closely ?COPD supportive care ?Chronic diastolic heart failure no change ?Paroxysmal atrial fibrillation rate is controlled at this time ? ? ?I have personally seen and evaluated the patient, evaluated laboratory and imaging results, formulated the assessment and plan and placed orders. ?The Patient requires high complexity decision making with multiple systems involvement.  ?Rounds were done with the Respiratory Therapy  Director and Staff therapists and discussed with nursing staff also. ? ?Yevonne Pax, MD FCCP ?Pulmonary Critical Care Medicine ?Sleep Medicine ? ?

## 2021-07-11 LAB — CBC
HCT: 23.8 % — ABNORMAL LOW (ref 36.0–46.0)
Hemoglobin: 7.5 g/dL — ABNORMAL LOW (ref 12.0–15.0)
MCH: 29.3 pg (ref 26.0–34.0)
MCHC: 31.5 g/dL (ref 30.0–36.0)
MCV: 93 fL (ref 80.0–100.0)
Platelets: 208 10*3/uL (ref 150–400)
RBC: 2.56 MIL/uL — ABNORMAL LOW (ref 3.87–5.11)
RDW: 17.3 % — ABNORMAL HIGH (ref 11.5–15.5)
WBC: 10.1 10*3/uL (ref 4.0–10.5)
nRBC: 0 % (ref 0.0–0.2)

## 2021-07-11 LAB — BASIC METABOLIC PANEL
Anion gap: 11 (ref 5–15)
BUN: 158 mg/dL — ABNORMAL HIGH (ref 8–23)
CO2: 23 mmol/L (ref 22–32)
Calcium: 7.8 mg/dL — ABNORMAL LOW (ref 8.9–10.3)
Chloride: 108 mmol/L (ref 98–111)
Creatinine, Ser: 4.01 mg/dL — ABNORMAL HIGH (ref 0.44–1.00)
GFR, Estimated: 12 mL/min — ABNORMAL LOW (ref >60)
Glucose, Bld: 122 mg/dL — ABNORMAL HIGH (ref 70–99)
Potassium: 6.1 mmol/L — ABNORMAL HIGH (ref 3.5–5.1)
Sodium: 142 mmol/L (ref 135–145)

## 2021-07-12 ENCOUNTER — Other Ambulatory Visit (HOSPITAL_COMMUNITY): Payer: Medicare PPO

## 2021-07-12 DIAGNOSIS — J9621 Acute and chronic respiratory failure with hypoxia: Secondary | ICD-10-CM | POA: Diagnosis not present

## 2021-07-12 DIAGNOSIS — J449 Chronic obstructive pulmonary disease, unspecified: Secondary | ICD-10-CM | POA: Diagnosis not present

## 2021-07-12 DIAGNOSIS — I5032 Chronic diastolic (congestive) heart failure: Secondary | ICD-10-CM | POA: Diagnosis not present

## 2021-07-12 DIAGNOSIS — J189 Pneumonia, unspecified organism: Secondary | ICD-10-CM | POA: Diagnosis not present

## 2021-07-12 HISTORY — PX: IR FLUORO GUIDE CV LINE RIGHT: IMG2283

## 2021-07-12 HISTORY — PX: IR US GUIDE VASC ACCESS RIGHT: IMG2390

## 2021-07-12 LAB — CBC
HCT: 24 % — ABNORMAL LOW (ref 36.0–46.0)
Hemoglobin: 7.4 g/dL — ABNORMAL LOW (ref 12.0–15.0)
MCH: 28.9 pg (ref 26.0–34.0)
MCHC: 30.8 g/dL (ref 30.0–36.0)
MCV: 93.8 fL (ref 80.0–100.0)
Platelets: 226 10*3/uL (ref 150–400)
RBC: 2.56 MIL/uL — ABNORMAL LOW (ref 3.87–5.11)
RDW: 16.7 % — ABNORMAL HIGH (ref 11.5–15.5)
WBC: 12.6 10*3/uL — ABNORMAL HIGH (ref 4.0–10.5)
nRBC: 0 % (ref 0.0–0.2)

## 2021-07-12 LAB — BASIC METABOLIC PANEL
Anion gap: 14 (ref 5–15)
BUN: 160 mg/dL — ABNORMAL HIGH (ref 8–23)
CO2: 24 mmol/L (ref 22–32)
Calcium: 8.7 mg/dL — ABNORMAL LOW (ref 8.9–10.3)
Chloride: 105 mmol/L (ref 98–111)
Creatinine, Ser: 4.46 mg/dL — ABNORMAL HIGH (ref 0.44–1.00)
GFR, Estimated: 10 mL/min — ABNORMAL LOW (ref >60)
Glucose, Bld: 112 mg/dL — ABNORMAL HIGH (ref 70–99)
Potassium: 4.8 mmol/L (ref 3.5–5.1)
Sodium: 143 mmol/L (ref 135–145)

## 2021-07-12 LAB — HEPATITIS B SURFACE ANTIGEN: Hepatitis B Surface Ag: NONREACTIVE

## 2021-07-12 LAB — HEPATITIS B SURFACE ANTIBODY,QUALITATIVE: Hep B S Ab: NONREACTIVE

## 2021-07-12 MED ORDER — LIDOCAINE HCL 1 % IJ SOLN
INTRAMUSCULAR | Status: AC
  Filled 2021-07-12: qty 20

## 2021-07-12 MED ORDER — HEPARIN SODIUM (PORCINE) 1000 UNIT/ML IJ SOLN
INTRAMUSCULAR | Status: AC
  Filled 2021-07-12: qty 10

## 2021-07-12 MED ORDER — HEPARIN SODIUM (PORCINE) 1000 UNIT/ML IJ SOLN
INTRAMUSCULAR | Status: DC | PRN
  Administered 2021-07-12: 2600 [IU] via INTRAVENOUS

## 2021-07-12 MED ORDER — LIDOCAINE HCL 1 % IJ SOLN
INTRAMUSCULAR | Status: DC | PRN
  Administered 2021-07-12: 10 mL

## 2021-07-12 NOTE — Procedures (Signed)
?  Procedure:  [place] and fluoro guided  Rt IJ temporary catheter insertion ?Preprocedure diagnosis: ESRD requiring HD ?Postprocedure diagnosis: same ?EBL:    minimal ?Complications:   none immediate ? ?See full dictation in [facility]. ? ?Frazier Richards, MD ?Main # [phone] ?Mobile [phone] ?   ?

## 2021-07-12 NOTE — Progress Notes (Signed)
Pulmonary Critical Care Medicine ?[place] ?  ?PULMONARY CRITICAL CARE SERVICE ? ?PROGRESS NOTE ? ? ? ? ?Sherry Mcmahon  ?[id number]  ?DOB: 08/19/1950  ? ?DOA: 06/16/2021 ? ?Referring Physician: Satira Sark, MD ? ?HPI: Sherry Mcmahon is a 71 y.o. female being followed for ventilator/airway/oxygen weaning Acute on Chronic Respiratory Failure.  Patient is getting hemodialysis catheter has been doing okay otherwise remains on mechanical ventilation ? ?Medications: ?Reviewed on Rounds ? ?Physical Exam: ? ?Vitals: Temperature 97.2 pulse 70 respiratory is 13 blood pressure 132/78 saturations 95% ? ?Ventilator Settings on full support assist control tidal volume 400 PEEP 5 ? ?General: Comfortable at this time ?Neck: supple ?Cardiovascular: no malignant arrhythmias ?Respiratory: No rhonchi or rales are noted ?Skin: no rash seen on limited exam ?Musculoskeletal: No gross abnormality ?Psychiatric:unable to assess ?Neurologic:no involuntary movements   ?   ?   ?Lab Data:  ? ?Basic Metabolic Panel: ?Recent Labs  ?Lab 07/09/21 ?0532 07/09/21 ?1020 07/10/21 ?[id number] 07/10/21 ?1158 07/11/21 ?0622 07/12/21 ?[id number]  ?NA 141 143 144  --  142 143  ?K 5.9* 6.2* 5.8* 6.2* 6.1* 4.8  ?CL 104 103 103  --  108 105  ?CO2 29 26 27   --  23 24  ?GLUCOSE 168* 145* 142*  --  122* 112*  ?BUN 138* 142* 145*  --  158* 160*  ?CREATININE 3.12* 3.21* 3.55*  --  4.01* 4.46*  ?CALCIUM 8.6* 8.6* 8.6*  --  7.8* 8.7*  ?MG 2.2  --   --   --   --   --   ? ? ?ABG: ?No results for input(s): PHART, PCO2ART, PO2ART, HCO3, O2SAT in the last 168 hours. ? ?Liver Function Tests: ?No results for input(s): AST, ALT, ALKPHOS, BILITOT, PROT, ALBUMIN in the last 168 hours. ?No results for input(s): LIPASE, AMYLASE in the last 168 hours. ?No results for input(s): AMMONIA in the last 168 hours. ? ?CBC: ?Recent Labs  ?Lab 07/09/21 ?0532 07/10/21 ?[id number] 07/11/21 ?[id number] 07/12/21 ?[id number]  ?WBC 11.3* 10.3 10.1 12.6*  ?HGB 6.1* 7.1* 7.5* 7.4*  ?HCT 19.5* 22.5* 23.8*  24.0*  ?MCV 96.1 92.2 93.0 93.8  ?PLT 233 209 208 226  ? ? ?Cardiac Enzymes: ?No results for input(s): CKTOTAL, CKMB, CKMBINDEX, TROPONINI in the last 168 hours. ? ?BNP (last 3 results) ?No results for input(s): BNP in the last 8760 hours. ? ?ProBNP (last 3 results) ?No results for input(s): PROBNP in the last 8760 hours. ? ?Radiological Exams: ?No results found. ? ?Assessment/Plan ?Active Problems: ?  Acute on chronic respiratory failure with hypoxia (HCC) ?  CAP (community acquired pneumonia) ?  COPD mixed type ([place]) ?  Chronic diastolic CHF (congestive heart failure) ([place]) ?  Paroxysmal atrial fibrillation (HCC) ? ? ?Acute on chronic respiratory failure with hypoxia we will proceed for hemodialysis per nephrology recommendations at this time we will continue on full vent support because of the starting up of dialysis ?Community-acquired pneumonia has been treated we will continue to follow along closely ?COPD no change ?Chronic diastolic heart failure compensated ?Paroxysmal atrial fibrillation rate is controlled ? ? ?I have personally seen and evaluated the patient, evaluated laboratory and imaging results, formulated the assessment and plan and placed orders. ?The Patient requires high complexity decision making with multiple systems involvement.  ?Rounds were done with the Respiratory Therapy Director and Staff therapists and discussed with nursing staff also. ? ?Allyne Gee, MD FCCP ?Pulmonary Critical Care Medicine ?Sleep Medicine ? ?

## 2021-07-13 DIAGNOSIS — I5032 Chronic diastolic (congestive) heart failure: Secondary | ICD-10-CM | POA: Diagnosis not present

## 2021-07-13 DIAGNOSIS — J449 Chronic obstructive pulmonary disease, unspecified: Secondary | ICD-10-CM | POA: Diagnosis not present

## 2021-07-13 DIAGNOSIS — J189 Pneumonia, unspecified organism: Secondary | ICD-10-CM | POA: Diagnosis not present

## 2021-07-13 DIAGNOSIS — J9621 Acute and chronic respiratory failure with hypoxia: Secondary | ICD-10-CM | POA: Diagnosis not present

## 2021-07-13 LAB — BASIC METABOLIC PANEL
Anion gap: 13 (ref 5–15)
BUN: 130 mg/dL — ABNORMAL HIGH (ref 8–23)
CO2: 27 mmol/L (ref 22–32)
Calcium: 8.6 mg/dL — ABNORMAL LOW (ref 8.9–10.3)
Chloride: 104 mmol/L (ref 98–111)
Creatinine, Ser: 3.91 mg/dL — ABNORMAL HIGH (ref 0.44–1.00)
GFR, Estimated: 12 mL/min — ABNORMAL LOW (ref >60)
Glucose, Bld: 148 mg/dL — ABNORMAL HIGH (ref 70–99)
Potassium: 3.9 mmol/L (ref 3.5–5.1)
Sodium: 144 mmol/L (ref 135–145)

## 2021-07-13 LAB — CULTURE, RESPIRATORY W GRAM STAIN

## 2021-07-13 LAB — CBC
HCT: 21.9 % — ABNORMAL LOW (ref 36.0–46.0)
Hemoglobin: 6.6 g/dL — CL (ref 12.0–15.0)
MCH: 28.7 pg (ref 26.0–34.0)
MCHC: 30.1 g/dL (ref 30.0–36.0)
MCV: 95.2 fL (ref 80.0–100.0)
Platelets: 145 10*3/uL — ABNORMAL LOW (ref 150–400)
RBC: 2.3 MIL/uL — ABNORMAL LOW (ref 3.87–5.11)
RDW: 16.3 % — ABNORMAL HIGH (ref 11.5–15.5)
WBC: 12.3 10*3/uL — ABNORMAL HIGH (ref 4.0–10.5)
nRBC: 0 % (ref 0.0–0.2)

## 2021-07-13 LAB — HEMOGLOBIN AND HEMATOCRIT, BLOOD
HCT: 26 % — ABNORMAL LOW (ref 36.0–46.0)
Hemoglobin: 8.2 g/dL — ABNORMAL LOW (ref 12.0–15.0)

## 2021-07-13 LAB — PREPARE RBC (CROSSMATCH)

## 2021-07-13 LAB — MAGNESIUM: Magnesium: 2.1 mg/dL (ref 1.7–2.4)

## 2021-07-13 NOTE — Progress Notes (Signed)
Pulmonary Critical Care Medicine ?[facility] [place] ?  ?PULMONARY CRITICAL CARE SERVICE ? ?PROGRESS NOTE ? ? ? ? ?[name]  ?[id number]  ?DOB: [birth year]  ? ?DOA: 06/14/2021 ? ?Referring Physician: Luna Kitchens, MD ? ?HPI: Sherry Mcmahon is a 71 y.o. female being followed for ventilator/airway/oxygen weaning Acute on Chronic Respiratory Failure.  Patient is on pressure support mode currently on 35% FiO2 has been on a pressure of 12/5 ? ?Medications: ?Reviewed on Rounds ? ?Physical Exam: ? ?Vitals: Temperature is 97.3 pulse of 80 respiratory rate is 20 blood pressure 146/82 saturations 94% ? ?Ventilator Settings on pressure support FiO2 is 35% pressure 12/5 tidal volume was 550 ? ?General: Comfortable at this time ?Neck: supple ?Cardiovascular: no malignant arrhythmias ?Respiratory: Scattered rhonchi expansion is equal ?Skin: no rash seen on limited exam ?Musculoskeletal: No gross abnormality ?Psychiatric:unable to assess ?Neurologic:no involuntary movements   ?   ?   ?Lab Data:  ? ?Basic Metabolic Panel: ?Recent Labs  ?Lab 07/09/21 ?0532 07/09/21 ?1020 07/10/21 ?2035 07/10/21 ?1158 07/11/21 ?0622 07/12/21 ?5974  ?NA 141 143 144  --  142 143  ?K 5.9* 6.2* 5.8* 6.2* 6.1* 4.8  ?CL 104 103 103  --  108 105  ?CO2 29 26 27   --  23 24  ?GLUCOSE 168* 145* 142*  --  122* 112*  ?BUN 138* 142* 145*  --  158* 160*  ?CREATININE 3.12* 3.21* 3.55*  --  4.01* 4.46*  ?CALCIUM 8.6* 8.6* 8.6*  --  7.8* 8.7*  ?MG 2.2  --   --   --   --   --   ? ? ?ABG: ?No results for input(s): PHART, PCO2ART, PO2ART, HCO3, O2SAT in the last 168 hours. ? ?Liver Function Tests: ?No results for input(s): AST, ALT, ALKPHOS, BILITOT, PROT, ALBUMIN in the last 168 hours. ?No results for input(s): LIPASE, AMYLASE in the last 168 hours. ?No results for input(s): AMMONIA in the last 168 hours. ? ?CBC: ?Recent Labs  ?Lab 07/09/21 ?0532 07/10/21 ?07/12/21 07/11/21 ?07/13/21 07/12/21 ?07/14/21  ?WBC 11.3* 10.3 10.1 12.6*  ?HGB 6.1* 7.1* 7.5* 7.4*  ?HCT 19.5*  22.5* 23.8* 24.0*  ?MCV 96.1 92.2 93.0 93.8  ?PLT 233 209 208 226  ? ? ?Cardiac Enzymes: ?No results for input(s): CKTOTAL, CKMB, CKMBINDEX, TROPONINI in the last 168 hours. ? ?BNP (last 3 results) ?No results for input(s): BNP in the last 8760 hours. ? ?ProBNP (last 3 results) ?No results for input(s): PROBNP in the last 8760 hours. ? ?Radiological Exams: ?IR Fluoro Guide CV Line Right ? ?Result Date: 07/13/2021 ?INDICATION: End-stage renal disease, requiring a temporary dialysis catheter for hemodialysis EXAM: Ultrasound and fluoroscopic guided right transjugular temporary dialysis catheter insertion MEDICATIONS: None; The antibiotic was not administered within an appropriate time interval prior to skin puncture. ANESTHESIA/SEDATION: Moderate (conscious) sedation was not employed during this procedure. FLUOROSCOPY: Radiation Exposure Index (as provided by the fluoroscopic device): 6 mGy Kerma COMPLICATIONS: None immediate. PROCEDURE: Informed written consent was obtained from the patient after a thorough discussion of the procedural risks, benefits and alternatives. All questions were addressed. Maximal Sterile Barrier Technique was utilized including caps, mask, sterile gowns, sterile gloves, sterile drape, hand hygiene and skin antiseptic. A timeout was performed prior to the initiation of the procedure. Under ultrasound guidance and by using a micropuncture set, right internal jugular vein was accessed. The ultrasound images of the patent internal jugular vein were recorded and sent to the PACS. 018 inch wire was introduced through the  needle, the wire was exchanged for a 4 French coaxial dilator. Then, inner dilator and microwire were exchanged for a 035 inch wire. Access site was dilated and a 12.5 [place] by 16 cm Mah[place]kar triple-lumen dialysis catheter was inserted. The tip of the catheter seen at the lower SVC region. Both ports of the catheter aspirated and flushed briskly. The ports were flushed with  saline and heparin solution and was anchored to the skin using 2-0 silk. Biopatch and sterile dressing was placed. Fluoroscopic spot images were obtained. IMPRESSION: Ultrasound and fluoroscopic guided right transjugular temporary dialysis catheter insertion. Electronically Signed   By: Marjo Bicker M.D.   On: 07/13/2021 08:07   ? ?Assessment/Plan ?Active Problems: ?  Acute on chronic respiratory failure with hypoxia (HCC) ?  CAP (community acquired pneumonia) ?  COPD mixed type (HCC) ?  Chronic diastolic CHF (congestive heart failure) (HCC) ?  Paroxysmal atrial fibrillation (HCC) ? ? ?Acute on chronic respiratory failure with hypoxia we will continue with the weaning pressure support FiO2 35% patient is supposed to be getting hemodialysis had a bit of difficulty with the catheter yesterday ?Community-acquired pneumonia has been treated with antibiotics plan is going to be to continue with supportive care still continues to have some hemoptysis once the dialysis issues sorted out we will reassess bronchoscopy ?Chronic diastolic heart failure appears to be compensated at this time we will continue with supportive care follow-up chest x-ray recommended ?Paroxysmal atrial fibrillation rate is controlled ?COPD supportive care ? ? ?I have personally seen and evaluated the patient, evaluated laboratory and imaging results, formulated the assessment and plan and placed orders. ?The Patient requires high complexity decision making with multiple systems involvement.  ?Rounds were done with the Respiratory Therapy Director and Staff therapists and discussed with nursing staff also. ? ?Yevonne Pax, MD FCCP ?Pulmonary Critical Care Medicine ?Sleep Medicine ? ?

## 2021-07-14 ENCOUNTER — Other Ambulatory Visit (HOSPITAL_COMMUNITY): Payer: Medicare PPO

## 2021-07-14 DIAGNOSIS — I5032 Chronic diastolic (congestive) heart failure: Secondary | ICD-10-CM | POA: Diagnosis not present

## 2021-07-14 DIAGNOSIS — J449 Chronic obstructive pulmonary disease, unspecified: Secondary | ICD-10-CM | POA: Diagnosis not present

## 2021-07-14 DIAGNOSIS — J9621 Acute and chronic respiratory failure with hypoxia: Secondary | ICD-10-CM | POA: Diagnosis not present

## 2021-07-14 DIAGNOSIS — J189 Pneumonia, unspecified organism: Secondary | ICD-10-CM | POA: Diagnosis not present

## 2021-07-14 LAB — TYPE AND SCREEN
ABO/RH(D): O POS
Antibody Screen: NEGATIVE
Unit division: 0

## 2021-07-14 LAB — BPAM RBC
Blood Product Expiration Date: 202306092359
ISSUE DATE / TIME: 202305161444
Unit Type and Rh: 5100

## 2021-07-14 NOTE — Progress Notes (Signed)
Pulmonary Critical Care Medicine ?[facility] [place] ?  ?PULMONARY CRITICAL CARE SERVICE ? ?PROGRESS NOTE ? ? ? ? ?Sherry Mcmahon  ?GYF:[id number]  ?DOB: 01-20-1951  ? ?DOA: 06/09/2021 ? ?Referring Physician: Luna Kitchens, MD ? ?HPI: Sherry Mcmahon is a 71 y.o. female being followed for ventilator/airway/oxygen weaning Acute on Chronic Respiratory Failure.  Patient is on the ventilator had low hemoglobin once again suspect that this bleeding is not pulmonary and likely is GI ? ?Medications: ?Reviewed on Rounds ? ?Physical Exam: ? ?Vitals: Temperature is 97.9 pulse 80 respiratory 36 blood pressure 155/78 saturations 96% ? ?Ventilator Settings on assist control full support ? ?General: Comfortable at this time ?Neck: supple ?Cardiovascular: no malignant arrhythmias ?Respiratory: Scattered rhonchi ?Skin: no rash seen on limited exam ?Musculoskeletal: No gross abnormality ?Psychiatric:unable to assess ?Neurologic:no involuntary movements   ?   ?   ?Lab Data:  ? ?Basic Metabolic Panel: ?Recent Labs  ?Lab 07/09/21 ?0532 07/09/21 ?1020 07/10/21 ?9163 07/10/21 ?1158 07/11/21 ?0622 07/12/21 ?8466 07/13/21 ?1058  ?NA 141 143 144  --  142 143 144  ?K 5.9* 6.2* 5.8* 6.2* 6.1* 4.8 3.9  ?CL 104 103 103  --  108 105 104  ?CO2 29 26 27   --  23 24 27   ?GLUCOSE 168* 145* 142*  --  122* 112* 148*  ?BUN 138* 142* 145*  --  158* 160* 130*  ?CREATININE 3.12* 3.21* 3.55*  --  4.01* 4.46* 3.91*  ?CALCIUM 8.6* 8.6* 8.6*  --  7.8* 8.7* 8.6*  ?MG 2.2  --   --   --   --   --  2.1  ? ? ?ABG: ?No results for input(s): PHART, PCO2ART, PO2ART, HCO3, O2SAT in the last 168 hours. ? ?Liver Function Tests: ?No results for input(s): AST, ALT, ALKPHOS, BILITOT, PROT, ALBUMIN in the last 168 hours. ?No results for input(s): LIPASE, AMYLASE in the last 168 hours. ?No results for input(s): AMMONIA in the last 168 hours. ? ?CBC: ?Recent Labs  ?Lab 07/09/21 ?0532 07/10/21 ?09/08/21 07/11/21 ?5993 07/12/21 ?5701 07/13/21 ?1058 07/13/21 ?2140  ?WBC 11.3* 10.3  10.1 12.6* 12.3*  --   ?HGB 6.1* 7.1* 7.5* 7.4* 6.6* 8.2*  ?HCT 19.5* 22.5* 23.8* 24.0* 21.9* 26.0*  ?MCV 96.1 92.2 93.0 93.8 95.2  --   ?PLT 233 209 208 226 145*  --   ? ? ?Cardiac Enzymes: ?No results for input(s): CKTOTAL, CKMB, CKMBINDEX, TROPONINI in the last 168 hours. ? ?BNP (last 3 results) ?No results for input(s): BNP in the last 8760 hours. ? ?ProBNP (last 3 results) ?No results for input(s): PROBNP in the last 8760 hours. ? ?Radiological Exams: ?IR Fluoro Guide CV Line Right ? ?Result Date: 07/13/2021 ?INDICATION: End-stage renal disease, requiring a temporary dialysis catheter for hemodialysis EXAM: Ultrasound and fluoroscopic guided right transjugular temporary dialysis catheter insertion MEDICATIONS: None; The antibiotic was not administered within an appropriate time interval prior to skin puncture. ANESTHESIA/SEDATION: Moderate (conscious) sedation was not employed during this procedure. FLUOROSCOPY: Radiation Exposure Index (as provided by the fluoroscopic device): 6 mGy Kerma COMPLICATIONS: None immediate. PROCEDURE: Informed written consent was obtained from the patient after a thorough discussion of the procedural risks, benefits and alternatives. All questions were addressed. Maximal Sterile Barrier Technique was utilized including caps, mask, sterile gowns, sterile gloves, sterile drape, hand hygiene and skin antiseptic. A timeout was performed prior to the initiation of the procedure. Under ultrasound guidance and by using a micropuncture set, right internal jugular vein was accessed. The  ultrasound images of the patent internal jugular vein were recorded and sent to the PACS. 018 inch wire was introduced through the needle, the wire was exchanged for a 4 French coaxial dilator. Then, inner dilator and microwire were exchanged for a 035 inch wire. Access site was dilated and a 12.5 [place] by 16 cm Mahurkar triple-lumen dialysis catheter was inserted. The tip of the catheter seen at the lower  SVC region. Both ports of the catheter aspirated and flushed briskly. The ports were flushed with saline and heparin solution and was anchored to the skin using 2-0 silk. Biopatch and sterile dressing was placed. Fluoroscopic spot images were obtained. IMPRESSION: Ultrasound and fluoroscopic guided right transjugular temporary dialysis catheter insertion. Electronically Signed   By: Marjo Bicker M.D.   On: 07/13/2021 08:07  ? ?IR US Guide Vasc Access Right ? ?Result Date: 07/13/2021 ?INDICATION: End-stage renal disease, requiring a temporary dialysis catheter for hemodialysis EXAM: Ultrasound and fluoroscopic guided right transjugular temporary dialysis catheter insertion MEDICATIONS: None; The antibiotic was not administered within an appropriate time interval prior to skin puncture. ANESTHESIA/SEDATION: Moderate (conscious) sedation was not employed during this procedure. FLUOROSCOPY: Radiation Exposure Index (as provided by the fluoroscopic device): 6 mGy Kerma COMPLICATIONS: None immediate. PROCEDURE: Informed written consent was obtained from the patient after a thorough discussion of the procedural risks, benefits and alternatives. All questions were addressed. Maximal Sterile Barrier Technique was utilized including caps, mask, sterile gowns, sterile gloves, sterile drape, hand hygiene and skin antiseptic. A timeout was performed prior to the initiation of the procedure. Under ultrasound guidance and by using a micropuncture set, right internal jugular vein was accessed. The ultrasound images of the patent internal jugular vein were recorded and sent to the PACS. 018 inch wire was introduced through the needle, the wire was exchanged for a 4 French coaxial dilator. Then, inner dilator and microwire were exchanged for a 035 inch wire. Access site was dilated and a 12.5 [place] by 16 cm Mahurkar triple-lumen dialysis catheter was inserted. The tip of the catheter seen at the lower SVC region. Both ports of the  catheter aspirated and flushed briskly. The ports were flushed with saline and heparin solution and was anchored to the skin using 2-0 silk. Biopatch and sterile dressing was placed. Fluoroscopic spot images were obtained. IMPRESSION: Ultrasound and fluoroscopic guided right transjugular temporary dialysis catheter insertion. Electronically Signed   By: Marjo Bicker M.D.   On: 07/13/2021 08:07   ? ?Assessment/Plan ?Active Problems: ?  Acute on chronic respiratory failure with hypoxia (HCC) ?  CAP (community acquired pneumonia) ?  COPD mixed type (HCC) ?  Chronic diastolic CHF (congestive heart failure) (HCC) ?  Paroxysmal atrial fibrillation (HCC) ? ? ?Acute on chronic respiratory failure with hypoxia patient's hemoglobin had dipped down once again to 6.3 which would not go along with a pulmonary source for bleeding patient transfused now hemoglobin back up ?Community-acquired pneumonia has been treated with antibiotics prognosis guarded ?COPD nebulizers as needed ?Chronic diastolic heart failure appears to be compensated ?Paroxysmal atrial fibrillation rate is controlled ? ? ?I have personally seen and evaluated the patient, evaluated laboratory and imaging results, formulated the assessment and plan and placed orders. ?The Patient requires high complexity decision making with multiple systems involvement.  ?Rounds were done with the Respiratory Therapy Director and Staff therapists and discussed with nursing staff also. ? ?Yevonne Pax, MD FCCP ?Pulmonary Critical Care Medicine ?Sleep Medicine ? ?

## 2021-07-14 NOTE — Consult Note (Signed)
?[facility] ?CONSULT NOTE  ? ? ?Date: 07/14/2021      ?      ?      ?Patient Name:  Sherry Mcmahon  MRN: [id number]  ?DOB: 1950/03/23  Age / Sex: 71 y.o., female   ?      ?PCP: Shayne Alken, MD     ?      ?      ?Service Requesting Consult: Hospitalist     ?      ?      ?Reason for Consult: Acute kidney injury     ?      ? ?History of Present Illness: ?Patient is a 71 y.o. female with a PMHx of COPD, longstanding alcohol abuse, paroxysmal atrial fibrillation, history of pacemaker placement, chronic diastolic heart failure, longstanding tobacco abuse, hypertension, spinal stenosis, osteopenia, depression, history of intracranial injury status post surgical intervention, HSV meningitis, acute respiratory failure, who was admitted to [facility] on 06/25/2021 for ongoing treatment.  She was admitted to outside hospital from 05/19/2021 till 06/17/2021.  She initially presented with altered mental status and lethargy.  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  She was treated with IV acyclovir.  She ended up having tracheostomy and PEG tube placement due to slow progress with weaning.  She also developed pseudomonal pneumonia during the initial hospitalization.  She also had developed a UTI with E. coli.  She was transferred here for subsequent care.  When the patient first presented here she was found to have a normal creatinine of 0.65.  On 07/05/2021 creatinine was still normal at 0.7.  4 days later on 07/09/2021 however her BUN was up to 138 with a creatinine of 3.12.  On 07/12/2021 BUN peaked at 160 with a creatinine of 4.4.  She was subsequently started on dialysis.  BUN now down to 130 with a creatinine of 3.9.  She had a temporary right IJ dialysis catheter placed. ? ? ?Medications: ? ? ?Allergies: ?Allergies  ?Allergen Reactions  ? Amlodipine   ?  Per [facility] chart, no reaction documented   ?  ?Ciprofloxacin 4 mg IV twice daily, sliding scale insulin,  albuterol 3 mils inhaled every 6 hours, budesonide 0.5 mg inhaled every 12 hours, clonazepam 0.25 mg twice daily, escitalopram 5 mg daily, ferrous sulfate 300 mg daily, folic acid 1 mg daily, guaifenesin 200 mg twice daily, ipratropium 2.5 mL inhaled every 6 hours, Lokelma 10 g 3 times daily, pantoprazole 40 mg twice daily, Pro-Stat 30 cc twice daily, quetiapine 25 mg twice daily, thiamine 100 mg daily, vitamin C 500 mg daily ? ?Past Medical History: ? ?COPD, longstanding alcohol abuse, paroxysmal atrial fibrillation, history of pacemaker placement, chronic diastolic heart failure, longstanding tobacco abuse, hypertension, spinal stenosis, osteopenia, depression, history of intracranial injury status post surgical intervention, HSV meningitis, acute respiratory failure ? ?Past Surgical History: ?Past Surgical History:  ?Procedure Laterality Date  ? IR CATHETER TUBE CHANGE  06/25/2021  ? IR FLUORO GUIDE CV LINE RIGHT  07/12/2021  ? IR THORACENTESIS ASP PLEURAL SPACE W/IMG GUIDE  06/22/2021  ? IR US GUIDE VASC ACCESS RIGHT  07/12/2021  ? ? ? ?Family History: ?Unable to obtain as the patient is currently on the ventilator ? ? ?Social History: ?Unable to obtain as the patient is currently on the ventilator ? ?Review of Systems: ?Unable to obtain as the patient is currently on the ventilator ? ?Vital Signs: ?Temperature 97.9 pulse 80  respirations 26 blood pressure 155/78 ? ?Weight trends: ?There were no vitals filed for this visit. ? ? ?Physical Exam: ?General: Critically ill-appearing  ?Head: Normocephalic, atraumatic. Moist oral mucosal membranes  ?Eyes: Anicteric  ?Neck: Tracheostomy in place  ?Lungs:  Coarse breath sounds bilateral, vent assisted  ?Heart: S1S2 no rubs  ?Abdomen:  Soft, nontender, bowel sounds present, PEG tube in place  ?Extremities: 1+ peripheral edema.  ?Neurologic: Arousable, will follow simple commands  ?Skin: No acute rash  ?Access: Right IJ temporary dialysis catheter  ? ? ?Lab results: ?Basic  Metabolic Panel: ?Recent Labs  ?Lab 07/09/21 ?0532 07/09/21 ?1020 07/11/21 ?0622 07/12/21 ?0655 07/13/21 ?1058  ?NA 141   < > 142 143 144  ?K 5.9*   < > 6.1* 4.8 3.9  ?CL 104   < > 108 105 104  ?CO2 29   < > 23 24 27   ?GLUCOSE 168*   < > 122* 112* 148*  ?BUN 138*   < > 158* 160* 130*  ?CREATININE 3.12*   < > 4.01* 4.46* 3.91*  ?CALCIUM 8.6*   < > 7.8* 8.7* 8.6*  ?MG 2.2  --   --   --  2.1  ? < > = values in this interval not displayed.  ? ? ?Liver Function Tests: ?No results for input(s): AST, ALT, ALKPHOS, BILITOT, PROT, ALBUMIN in the last 168 hours. ?No results for input(s): LIPASE, AMYLASE in the last 168 hours. ?No results for input(s): AMMONIA in the last 168 hours. ? ?CBC: ?Recent Labs  ?Lab 07/09/21 ?0532 07/10/21 ?[id number] 07/11/21 ?[id number] 07/12/21 ?[id number] 07/13/21 ?1058 07/13/21 ?2140  ?WBC 11.3* 10.3 10.1 12.6* 12.3*  --   ?HGB 6.1* 7.1* 7.5* 7.4* 6.6* 8.2*  ?HCT 19.5* 22.5* 23.8* 24.0* 21.9* 26.0*  ?MCV 96.1 92.2 93.0 93.8 95.2  --   ?PLT 233 209 208 226 145*  --   ? ? ?Cardiac Enzymes: ?No results for input(s): CKTOTAL, CKMB, CKMBINDEX, TROPONINI in the last 168 hours. ? ?BNP: ?Invalid input(s): POCBNP ? ?CBG: ?No results for input(s): GLUCAP in the last 168 hours. ? ?Microbiology: ?Results for orders placed or performed during the hospital encounter of 06/16/2021  ?Culture, Respiratory w Gram Stain     Status: None  ? Collection Time: 07/01/21  7:16 PM  ? Specimen: Tracheal Aspirate; Respiratory  ?Result Value Ref Range Status  ? Specimen Description TRACHEAL ASPIRATE  Final  ? Special Requests Normal  Final  ? Gram Stain   Final  ?  ABUNDANT NO YEAST OR FUNGAL ELEMENTS SEEN ?FEW GRAM NEGATIVE RODS ?Performed at [facility], [address] [place], [place] [id number] ?  ? Culture MODERATE PSEUDOMONAS AERUGINOSA  Final  ? Report Status 07/04/2021 FINAL  Final  ? Organism ID, Bacteria PSEUDOMONAS AERUGINOSA  Final  ?    Susceptibility  ? Pseudomonas aeruginosa - MIC*  ?  CEFTAZIDIME 32 RESISTANT Resistant    ?  CIPROFLOXACIN <=0.25 SENSITIVE Sensitive   ?  GENTAMICIN <=1 SENSITIVE Sensitive   ?  IMIPENEM 2 SENSITIVE Sensitive   ?  * MODERATE PSEUDOMONAS AERUGINOSA  ?Culture, Respiratory w Gram Stain     Status: None  ? Collection Time: 07/11/21  4:30 PM  ? Specimen: Tracheal Aspirate  ?Result Value Ref Range Status  ? Specimen Description TRACHEAL ASPIRATE  Final  ? Special Requests NONE  Final  ? Gram Stain   Final  ?  MODERATE WBC PRESENT,BOTH PMN AND MONONUCLEAR ?FEW GRAM NEGATIVE RODS ?  ? Culture  Final  ?  FEW PSEUDOMONAS AERUGINOSA ?NO STAPHYLOCOCCUS AUREUS ISOLATED ?Performed at [facility], [address], [place], [place] [address] ?  ? Report Status 07/13/2021 FINAL  Final  ? Organism ID, Bacteria PSEUDOMONAS AERUGINOSA  Final  ?    Susceptibility  ? Pseudomonas aeruginosa - MIC*  ?  CEFTAZIDIME 16 INTERMEDIATE Intermediate   ?  CIPROFLOXACIN <=0.25 SENSITIVE Sensitive   ?  GENTAMICIN <=1 SENSITIVE Sensitive   ?  IMIPENEM 2 SENSITIVE Sensitive   ?  * FEW PSEUDOMONAS AERUGINOSA  ? ? ?Coagulation Studies: ?No results for input(s): LABPROT, INR in the last 72 hours. ? ?Urinalysis: ?No results for input(s): COLORURINE, LABSPEC, PHURINE, GLUCOSEU, HGBUR, BILIRUBINUR, KETONESUR, PROTEINUR, UROBILINOGEN, NITRITE, LEUKOCYTESUR in the last 72 hours. ? ?Invalid input(s): APPERANCEUR  ? ? ?Imaging: ?IR Fluoro Guide CV Line Right ? ?Result Date: 07/13/2021 ?INDICATION: End-stage renal disease, requiring a temporary dialysis catheter for hemodialysis EXAM: Ultrasound and fluoroscopic guided right transjugular temporary dialysis catheter insertion MEDICATIONS: None; The antibiotic was not administered within an appropriate time interval prior to skin puncture. ANESTHESIA/SEDATION: Moderate (conscious) sedation was not employed during this procedure. FLUOROSCOPY: Radiation Exposure Index (as provided by the fluoroscopic device): 6 mGy Kerma COMPLICATIONS: None immediate. PROCEDURE: Informed written consent was  obtained from the patient after a thorough discussion of the procedural risks, benefits and alternatives. All questions were addressed. Maximal Sterile Barrier Technique was utilized including caps, mask, sterile

## 2021-07-15 DIAGNOSIS — J9621 Acute and chronic respiratory failure with hypoxia: Secondary | ICD-10-CM | POA: Diagnosis not present

## 2021-07-15 DIAGNOSIS — J449 Chronic obstructive pulmonary disease, unspecified: Secondary | ICD-10-CM | POA: Diagnosis not present

## 2021-07-15 DIAGNOSIS — I5032 Chronic diastolic (congestive) heart failure: Secondary | ICD-10-CM | POA: Diagnosis not present

## 2021-07-15 DIAGNOSIS — J189 Pneumonia, unspecified organism: Secondary | ICD-10-CM | POA: Diagnosis not present

## 2021-07-15 LAB — BASIC METABOLIC PANEL
Anion gap: 12 (ref 5–15)
BUN: 90 mg/dL — ABNORMAL HIGH (ref 8–23)
CO2: 25 mmol/L (ref 22–32)
Calcium: 8.5 mg/dL — ABNORMAL LOW (ref 8.9–10.3)
Chloride: 99 mmol/L (ref 98–111)
Creatinine, Ser: 3.06 mg/dL — ABNORMAL HIGH (ref 0.44–1.00)
GFR, Estimated: 16 mL/min — ABNORMAL LOW (ref >60)
Glucose, Bld: 112 mg/dL — ABNORMAL HIGH (ref 70–99)
Potassium: 3.5 mmol/L (ref 3.5–5.1)
Sodium: 136 mmol/L (ref 135–145)

## 2021-07-15 LAB — CBC
HCT: 25 % — ABNORMAL LOW (ref 36.0–46.0)
Hemoglobin: 7.8 g/dL — ABNORMAL LOW (ref 12.0–15.0)
MCH: 29.1 pg (ref 26.0–34.0)
MCHC: 31.2 g/dL (ref 30.0–36.0)
MCV: 93.3 fL (ref 80.0–100.0)
Platelets: 105 10*3/uL — ABNORMAL LOW (ref 150–400)
RBC: 2.68 MIL/uL — ABNORMAL LOW (ref 3.87–5.11)
RDW: 16.1 % — ABNORMAL HIGH (ref 11.5–15.5)
WBC: 9.6 10*3/uL (ref 4.0–10.5)
nRBC: 0 % (ref 0.0–0.2)

## 2021-07-15 LAB — MAGNESIUM: Magnesium: 1.8 mg/dL (ref 1.7–2.4)

## 2021-07-15 NOTE — Progress Notes (Addendum)
Pulmonary Critical Care Medicine [place]   PULMONARY CRITICAL CARE SERVICE  PROGRESS NOTE     Sherry Mcmahon  [id number]  DOB: [birth year]   DOA: 06/23/2021  Referring Physician: Satira Sark, MD  HPI: Sherry Mcmahon is a 71 y.o. female being followed for ventilator/airway/oxygen weaning Acute on Chronic Respiratory Failure. Patient seen lying in bed, currently receiving diaclasis. She was only able to complete 9.5 out of 16 hours PS goal yesterday. No acute distress, no acute overnight event.  Medications: Reviewed on Rounds  Physical Exam:  Vitals: temp 98.0, pulse 80, respirations 25, bp 133/80, sp02 99%  Ventilator Settings PS FiO2 is 35% 12/5 TV 550    General: Comfortable at this time Neck: supple Cardiovascular: no malignant arrhythmias Respiratory: Right lung severely diminished, left lung clear Skin: no rash seen on limited exam Musculoskeletal: No gross abnormality Psychiatric:unable to assess Neurologic:no involuntary movements         Lab Data:   Basic Metabolic Panel: Recent Labs  Lab 07/09/21 0532 07/09/21 1020 07/10/21 0517 07/10/21 1158 07/11/21 0622 07/12/21 0655 07/13/21 1058 07/15/21 0447  NA 141   < > 144  --  142 143 144 136  K 5.9*   < > 5.8* 6.2* 6.1* 4.8 3.9 3.5  CL 104   < > 103  --  108 105 104 99  CO2 29   < > 27  --  23 24 27 25   GLUCOSE 168*   < > 142*  --  122* 112* 148* 112*  BUN 138*   < > 145*  --  158* 160* 130* 90*  CREATININE 3.12*   < > 3.55*  --  4.01* 4.46* 3.91* 3.06*  CALCIUM 8.6*   < > 8.6*  --  7.8* 8.7* 8.6* 8.5*  MG 2.2  --   --   --   --   --  2.1 1.8   < > = values in this interval not displayed.    ABG: No results for input(s): PHART, PCO2ART, PO2ART, HCO3, O2SAT in the last 168 hours.  Liver Function Tests: No results for input(s): AST, ALT, ALKPHOS, BILITOT, PROT, ALBUMIN in the last 168 hours. No results for input(s): LIPASE, AMYLASE in the last 168 hours. No results for  input(s): AMMONIA in the last 168 hours.  CBC: Recent Labs  Lab 07/10/21 0517 07/11/21 0442 07/12/21 0655 07/13/21 1058 07/13/21 2140 07/15/21 0447  WBC 10.3 10.1 12.6* 12.3*  --  9.6  HGB 7.1* 7.5* 7.4* 6.6* 8.2* 7.8*  HCT 22.5* 23.8* 24.0* 21.9* 26.0* 25.0*  MCV 92.2 93.0 93.8 95.2  --  93.3  PLT 209 208 226 145*  --  105*    Cardiac Enzymes: No results for input(s): CKTOTAL, CKMB, CKMBINDEX, TROPONINI in the last 168 hours.  BNP (last 3 results) No results for input(s): BNP in the last 8760 hours.  ProBNP (last 3 results) No results for input(s): PROBNP in the last 8760 hours.  Radiological Exams: DG Chest Port 1 View  Result Date: 07/14/2021 CLINICAL DATA:  Shortness of breath. EXAM: PORTABLE CHEST 1 VIEW COMPARISON:  Chest x-ray dated Jul 06, 2021. FINDINGS: New right internal jugular dialysis catheter with tip at the cavoatrial junction. Unchanged tracheostomy tube and left chest wall pacemaker. Stable cardiomegaly. New mild diffuse interstitial thickening. Unchanged consolidative opacity and architectural distortion in the right mid lung. No pneumothorax or large pleural effusion. No acute osseous abnormality. IMPRESSION: 1. New mild interstitial pulmonary edema. 2.  Unchanged right mid lung pneumonia and architectural distortion. Electronically Signed   By: Titus Dubin M.D.   On: 07/14/2021 08:57    Assessment/Plan Active Problems:   Acute on chronic respiratory failure with hypoxia (HCC)   CAP (community acquired pneumonia)   COPD mixed type ([place])   Chronic diastolic CHF (congestive heart failure) (HCC)   Paroxysmal atrial fibrillation (HCC)  Acute on chronic respiratory failure with hypoxia -we will continue with the weaning pressure support FiO2 35%, currently receiving dialysis. She was only able to complete 9.5 out of 16 hour PS goal yesterday so she will again have a 16 hour goal today. Community-acquired pneumonia -has been treated with antibiotics. continues  to have some hemoptysis ,once the dialysis issues sorted out we will reassess bronchoscopy. We will continue with supportive care. Chronic diastolic heart failure- appears to be compensated at this time ,we will continue with supportive care and follow-up chest x-ray recommended. Paroxysmal atrial fibrillation -rate is controlled, continue with telemetry COPD- continue medical management and  supportive care  I have personally seen and evaluated the patient, evaluated laboratory and imaging results, formulated the assessment and plan and placed orders. The Patient requires high complexity decision making with multiple systems involvement.  Rounds were done with the Respiratory Therapy Director and Staff therapists and discussed with nursing staff also.  Allyne Gee, MD [facility] Pulmonary Critical Care Medicine Sleep Medicine

## 2021-07-16 DIAGNOSIS — J449 Chronic obstructive pulmonary disease, unspecified: Secondary | ICD-10-CM | POA: Diagnosis not present

## 2021-07-16 DIAGNOSIS — I5032 Chronic diastolic (congestive) heart failure: Secondary | ICD-10-CM | POA: Diagnosis not present

## 2021-07-16 DIAGNOSIS — J189 Pneumonia, unspecified organism: Secondary | ICD-10-CM | POA: Diagnosis not present

## 2021-07-16 DIAGNOSIS — J9621 Acute and chronic respiratory failure with hypoxia: Secondary | ICD-10-CM | POA: Diagnosis not present

## 2021-07-16 NOTE — Progress Notes (Addendum)
   Patient Status: [facility]  Assessment and Plan: Patient in need of venous access.   Tunneled hemodialysis catheter placement  ______________________________________________________________________   History of Present Illness: Sherry Mcmahon is a 71 y.o. female   Acute renal failure Non tunneled dialysis catheter placed in IR 07/12/21 Nephrology request tunneled cath for next week Temp cath in use-- functioning well  Secondary to IR schedule-- plans made for tunneled cath early next week Spoke to [employer]-- he is agreeable  Allergies and medications reviewed.   Review of Systems: A 12 point ROS discussed and pertinent positives are indicated in the HPI above.  All other systems are negative.  Vital Signs: BP 135/77 (BP Location: Left Leg)   Pulse 80   Resp (!) 23   Physical Exam Vitals reviewed.  HENT:     Mouth/Throat:     Mouth: Mucous membranes are moist.  Cardiovascular:     Rate and Rhythm: Normal rate.  Pulmonary:     Comments: Vent  Skin:    General: Skin is warm.  Neurological:     Comments: Looks at me No other repsonse  Psychiatric:     Comments: Spoke to son Sherry Mcmahon via phone-- he consents to procedure     Imaging reviewed.   Labs:  COAGS: Recent Labs    Jul 16, 2021 0446  INR 1.4*    BMP: Recent Labs    07/11/21 0622 07/12/21 0655 07/13/21 1058 07/15/21 0447  NA 142 143 144 136  K 6.1* 4.8 3.9 3.5  CL 108 105 104 99  CO2 23 24 27 25   GLUCOSE 122* 112* 148* 112*  BUN 158* 160* 130* 90*  CALCIUM 7.8* 8.7* 8.6* 8.5*  CREATININE 4.01* 4.46* 3.91* 3.06*  GFRNONAA 12* 10* 12* 16*    Scheduled for tunneled dialysis catheter placement in IR early next week Risks and benefits discussed with the patient's son including, but not limited to bleeding, infection, vascular injury, pneumothorax which may require chest tube placement, air embolism or even death  All questions were answered, Sherry Mcmahon is agreeable to proceed. Consent  signed and in chart.    Electronically Signed: [name], PA-C 07/16/2021, 3:27 PM   I spent a total of 15 minutes in face to face in clinical consultation, greater than 50% of which was counseling/coordinating care for venous access.

## 2021-07-16 NOTE — Progress Notes (Signed)
[facility]  ROUNDING NOTE   Subjective:  Patient found to have vomited over herself this AM. Due for hemodialysis treatment again today. This would be her third dialysis treatment.   Objective:  Vital signs in last 24 hours:  Temperature 97 pulse 80 respirations 21 blood pressure 99/58  Physical Exam: General: Critically ill-appearing  Head: Normocephalic, atraumatic. Moist oral mucosal membranes  Eyes: Anicteric  Neck: Tracheostomy in place  Lungs:  Coarse breath sounds bilateral, vent assisted  Heart: S1S2 no rubs  Abdomen:  Soft, nontender, bowel sounds present, PEG tube in place  Extremities: 1+ peripheral edema.  Neurologic: Arousable, will follow simple commands  Skin: No acute rash  Access: Right IJ temporary dialysis catheter    Basic Metabolic Panel: Recent Labs  Lab 07/10/21 0517 07/10/21 1158 07/11/21 0622 07/12/21 0655 07/13/21 1058 07/15/21 0447  NA 144  --  142 143 144 136  K 5.8* 6.2* 6.1* 4.8 3.9 3.5  CL 103  --  108 105 104 99  CO2 27  --  23 24 27 25   GLUCOSE 142*  --  122* 112* 148* 112*  BUN 145*  --  158* 160* 130* 90*  CREATININE 3.55*  --  4.01* 4.46* 3.91* 3.06*  CALCIUM 8.6*  --  7.8* 8.7* 8.6* 8.5*  MG  --   --   --   --  2.1 1.8    Liver Function Tests: No results for input(s): AST, ALT, ALKPHOS, BILITOT, PROT, ALBUMIN in the last 168 hours. No results for input(s): LIPASE, AMYLASE in the last 168 hours. No results for input(s): AMMONIA in the last 168 hours.  CBC: Recent Labs  Lab 07/10/21 0517 07/11/21 0442 07/12/21 0655 07/13/21 1058 07/13/21 2140 07/15/21 0447  WBC 10.3 10.1 12.6* 12.3*  --  9.6  HGB 7.1* 7.5* 7.4* 6.6* 8.2* 7.8*  HCT 22.5* 23.8* 24.0* 21.9* 26.0* 25.0*  MCV 92.2 93.0 93.8 95.2  --  93.3  PLT 209 208 226 145*  --  105*    Cardiac Enzymes: No results for input(s): CKTOTAL, CKMB, CKMBINDEX, TROPONINI in the last 168 hours.  BNP: Invalid input(s): POCBNP  CBG: No results for  input(s): GLUCAP in the last 168 hours.  Microbiology: Results for orders placed or performed during the hospital encounter of 05/29/2021  Culture, Respiratory w Gram Stain     Status: None   Collection Time: 07/01/21  7:16 PM   Specimen: Tracheal Aspirate; Respiratory  Result Value Ref Range Status   Specimen Description TRACHEAL ASPIRATE  Final   Special Requests Normal  Final   Gram Stain   Final    ABUNDANT NO YEAST OR FUNGAL ELEMENTS SEEN FEW GRAM NEGATIVE RODS Performed at [facility], [address], [place], [place] [address]    Culture MODERATE PSEUDOMONAS AERUGINOSA  Final   Report Status 07/04/2021 FINAL  Final   Organism ID, Bacteria PSEUDOMONAS AERUGINOSA  Final      Susceptibility   Pseudomonas aeruginosa - MIC*    CEFTAZIDIME 32 RESISTANT Resistant     CIPROFLOXACIN <=0.25 SENSITIVE Sensitive     GENTAMICIN <=1 SENSITIVE Sensitive     IMIPENEM 2 SENSITIVE Sensitive     * MODERATE PSEUDOMONAS AERUGINOSA  Culture, Respiratory w Gram Stain     Status: None   Collection Time: 07/11/21  4:30 PM   Specimen: Tracheal Aspirate  Result Value Ref Range Status   Specimen Description TRACHEAL ASPIRATE  Final   Special Requests NONE  Final  Gram Stain   Final    MODERATE WBC PRESENT,BOTH PMN AND MONONUCLEAR FEW GRAM NEGATIVE RODS    Culture   Final    FEW PSEUDOMONAS AERUGINOSA NO STAPHYLOCOCCUS AUREUS ISOLATED Performed at [facility], [address] [place], [place] [address]    Report Status 07/13/2021 FINAL  Final   Organism ID, Bacteria PSEUDOMONAS AERUGINOSA  Final      Susceptibility   Pseudomonas aeruginosa - MIC*    CEFTAZIDIME 16 INTERMEDIATE Intermediate     CIPROFLOXACIN <=0.25 SENSITIVE Sensitive     GENTAMICIN <=1 SENSITIVE Sensitive     IMIPENEM 2 SENSITIVE Sensitive     * FEW PSEUDOMONAS AERUGINOSA    Coagulation Studies: No results for input(s): LABPROT, INR in the last 72 hours.  Urinalysis: No results for input(s): COLORURINE,  LABSPEC, PHURINE, GLUCOSEU, HGBUR, BILIRUBINUR, KETONESUR, PROTEINUR, UROBILINOGEN, NITRITE, LEUKOCYTESUR in the last 72 hours.  Invalid input(s): APPERANCEUR    Imaging: No results found.   Medications:     heparin sodium (porcine), lidocaine, lidocaine  Assessment/ Plan:  71 y.o. female  with a PMHx of COPD, longstanding alcohol abuse, paroxysmal atrial fibrillation, history of pacemaker placement, chronic diastolic heart failure, longstanding tobacco abuse, hypertension, spinal stenosis, osteopenia, depression, history of intracranial injury status post surgical intervention, HSV meningitis, acute respiratory failure, who was admitted to [facility] on 06/09/2021 for ongoing treatment.    1.  Acute kidney injury.  Appears to be multifactorial but most likely due to ATN.  Renal ultrasound was negative for obstruction.   -Patient due for third dialysis treatment today.  Orders have been prepared.  Ultrafiltration target 1 kg.  2.  Hyperkalemia.  Serum potassium normalized now at 3.5.  3.  Acute respiratory failure.  Continue ventilatory support and weaning as per pulmonary/critical care.  Intermittently she has been on aerosolized trach collar.   LOS: 0 Brigitta Pricer 5/19/202312:47 PM

## 2021-07-16 NOTE — Progress Notes (Addendum)
Pulmonary Critical Care Medicine [facility] [place]   PULMONARY CRITICAL CARE SERVICE  PROGRESS NOTE     Sherry Mcmahon  YIF:[id number]  DOB: 1950-11-24   DOA: 06/13/2021  Referring Physician: Luna Kitchens, MD  HPI: Sherry Mcmahon is a 71 y.o. female being followed for ventilator/airway/oxygen weaning Acute on Chronic Respiratory Failure. Patient seen lying in bed, currently on full support. She was able to complete 2 hours ATC goal yesterday. She is receiving saline ice bullets. No acute distress, no acute overnight event.  Medications: Reviewed on Rounds  Physical Exam:  Vitals: temp 97.7, pulse 80, respirations 21, bp 99/88, sp02 97%  Ventilator Settings AC VC+, 28%  General: Comfortable at this time Neck: supple Cardiovascular: no malignant arrhythmias Respiratory: Bilaterally clear Skin: no rash seen on limited exam Musculoskeletal: No gross abnormality Psychiatric:unable to assess Neurologic:no involuntary movements         Lab Data:   Basic Metabolic Panel: Recent Labs  Lab 07/10/21 0517 07/10/21 1158 07/11/21 0622 07/12/21 0655 07/13/21 1058 07/15/21 0447  NA 144  --  142 143 144 136  K 5.8* 6.2* 6.1* 4.8 3.9 3.5  CL 103  --  108 105 104 99  CO2 27  --  23 24 27 25   GLUCOSE 142*  --  122* 112* 148* 112*  BUN 145*  --  158* 160* 130* 90*  CREATININE 3.55*  --  4.01* 4.46* 3.91* 3.06*  CALCIUM 8.6*  --  7.8* 8.7* 8.6* 8.5*  MG  --   --   --   --  2.1 1.8    ABG: No results for input(s): PHART, PCO2ART, PO2ART, HCO3, O2SAT in the last 168 hours.  Liver Function Tests: No results for input(s): AST, ALT, ALKPHOS, BILITOT, PROT, ALBUMIN in the last 168 hours. No results for input(s): LIPASE, AMYLASE in the last 168 hours. No results for input(s): AMMONIA in the last 168 hours.  CBC: Recent Labs  Lab 07/10/21 0517 07/11/21 0442 07/12/21 0655 07/13/21 1058 07/13/21 2140 07/15/21 0447  WBC 10.3 10.1 12.6* 12.3*  --  9.6  HGB 7.1* 7.5*  7.4* 6.6* 8.2* 7.8*  HCT 22.5* 23.8* 24.0* 21.9* 26.0* 25.0*  MCV 92.2 93.0 93.8 95.2  --  93.3  PLT 209 208 226 145*  --  105*    Cardiac Enzymes: No results for input(s): CKTOTAL, CKMB, CKMBINDEX, TROPONINI in the last 168 hours.  BNP (last 3 results) No results for input(s): BNP in the last 8760 hours.  ProBNP (last 3 results) No results for input(s): PROBNP in the last 8760 hours.  Radiological Exams: No results found.  Assessment/Plan Active Problems:   Acute on chronic respiratory failure with hypoxia (HCC)   CAP (community acquired pneumonia)   COPD mixed type (HCC)   Chronic diastolic CHF (congestive heart failure) (HCC)   Paroxysmal atrial fibrillation (HCC)  Acute on chronic respiratory failure with hypoxia -we will continue with weaning per protocol. She completed ATC 2 hour goal yesterday and will attempt a 4 hour ATC goal today. Community-acquired pneumonia -has been treated with antibiotics. continues to have some hemoptysis ,once the dialysis issues sorted out we will reassess bronchoscopy. We will continue with supportive care. Chronic diastolic heart failure- appears to be compensated at this time ,we will continue with supportive care and follow-up chest x-ray recommended. Paroxysmal atrial fibrillation -rate is controlled, continue with telemetry COPD- continue medical management and  supportive care   I have personally seen and evaluated the patient,  evaluated laboratory and imaging results, formulated the assessment and plan and placed orders. The Patient requires high complexity decision making with multiple systems involvement.  Rounds were done with the Respiratory Therapy Director and Staff therapists and discussed with nursing staff also.  Allyne Gee, MD [facility] Pulmonary Critical Care Medicine Sleep Medicine

## 2021-07-17 DIAGNOSIS — I5032 Chronic diastolic (congestive) heart failure: Secondary | ICD-10-CM | POA: Diagnosis not present

## 2021-07-17 DIAGNOSIS — J189 Pneumonia, unspecified organism: Secondary | ICD-10-CM | POA: Diagnosis not present

## 2021-07-17 DIAGNOSIS — J9621 Acute and chronic respiratory failure with hypoxia: Secondary | ICD-10-CM | POA: Diagnosis not present

## 2021-07-17 DIAGNOSIS — J449 Chronic obstructive pulmonary disease, unspecified: Secondary | ICD-10-CM | POA: Diagnosis not present

## 2021-07-17 LAB — CBC
HCT: 21.1 % — ABNORMAL LOW (ref 36.0–46.0)
Hemoglobin: 6.3 g/dL — CL (ref 12.0–15.0)
MCH: 28.9 pg (ref 26.0–34.0)
MCHC: 29.9 g/dL — ABNORMAL LOW (ref 30.0–36.0)
MCV: 96.8 fL (ref 80.0–100.0)
Platelets: 104 10*3/uL — ABNORMAL LOW (ref 150–400)
RBC: 2.18 MIL/uL — ABNORMAL LOW (ref 3.87–5.11)
RDW: 15.9 % — ABNORMAL HIGH (ref 11.5–15.5)
WBC: 15.3 10*3/uL — ABNORMAL HIGH (ref 4.0–10.5)
nRBC: 0 % (ref 0.0–0.2)

## 2021-07-17 LAB — PREPARE RBC (CROSSMATCH)

## 2021-07-17 NOTE — Progress Notes (Addendum)
Pulmonary Critical Care Medicine [facility] [place]   PULMONARY CRITICAL CARE SERVICE  PROGRESS NOTE     Sherry Mcmahon  PJK:[id number]  DOB: [birth year]   DOA: 06-20-2021  Referring Physician: Luna Kitchens, MD  HPI: Sherry Mcmahon is a 71 y.o. female being followed for ventilator/airway/oxygen weaning Acute on Chronic Respiratory Failure.  Patient seen lying in bed.  Currently remains on full support.  He has needed increased FiO2 yesterday 70%, currently respiratory has been able to get him down to 50%. His hemoglobin is low this morning and he will receive transfusion so we are can hold off weaning today.  No acute distress.  Medications: Reviewed on Rounds  Physical Exam:  Vitals: Temp 99.8, pulse 82, respirations 18, BP 110/65, SPO2 90%  Ventilator Settings AC VC plus, FiO2 50%, rate 16, tidal volume 400, PEEP 5  General: Comfortable at this time Neck: supple Cardiovascular: no malignant arrhythmias Respiratory: Bilaterally clear Skin: no rash seen on limited exam Musculoskeletal: No gross abnormality Psychiatric:unable to assess Neurologic:no involuntary movements         Lab Data:   Basic Metabolic Panel: Recent Labs  Lab 07/10/21 1158 07/11/21 0622 07/12/21 0655 07/13/21 1058 07/15/21 0447  NA  --  142 143 144 136  K 6.2* 6.1* 4.8 3.9 3.5  CL  --  108 105 104 99  CO2  --  23 24 27 25   GLUCOSE  --  122* 112* 148* 112*  BUN  --  158* 160* 130* 90*  CREATININE  --  4.01* 4.46* 3.91* 3.06*  CALCIUM  --  7.8* 8.7* 8.6* 8.5*  MG  --   --   --  2.1 1.8    ABG: No results for input(s): PHART, PCO2ART, PO2ART, HCO3, O2SAT in the last 168 hours.  Liver Function Tests: No results for input(s): AST, ALT, ALKPHOS, BILITOT, PROT, ALBUMIN in the last 168 hours. No results for input(s): LIPASE, AMYLASE in the last 168 hours. No results for input(s): AMMONIA in the last 168 hours.  CBC: Recent Labs  Lab 07/11/21 0442 07/12/21 0655 07/13/21 1058  07/13/21 2140 07/15/21 0447 07/17/21 0628  WBC 10.1 12.6* 12.3*  --  9.6 15.3*  HGB 7.5* 7.4* 6.6* 8.2* 7.8* 6.3*  HCT 23.8* 24.0* 21.9* 26.0* 25.0* 21.1*  MCV 93.0 93.8 95.2  --  93.3 96.8  PLT 208 226 145*  --  105* 104*    Cardiac Enzymes: No results for input(s): CKTOTAL, CKMB, CKMBINDEX, TROPONINI in the last 168 hours.  BNP (last 3 results) No results for input(s): BNP in the last 8760 hours.  ProBNP (last 3 results) No results for input(s): PROBNP in the last 8760 hours.  Radiological Exams: No results found.  Assessment/Plan Active Problems:   Acute on chronic respiratory failure with hypoxia (HCC)   CAP (community acquired pneumonia)   COPD mixed type (HCC)   Chronic diastolic CHF (congestive heart failure) (HCC)   Paroxysmal atrial fibrillation (HCC)   Acute on chronic respiratory failure with hypoxia -we will continue with weaning per protocol. She completed ATC 4 hour goal yesterday.  We will hold weaning today due to low hemoglobin. Community-acquired pneumonia -has been treated with antibiotics. continues to have some hemoptysis ,once the dialysis issues sorted out we will reassess bronchoscopy. We will continue with supportive care. Chronic diastolic heart failure- appears to be compensated at this time ,we will continue with supportive care and follow-up chest x-ray recommended. Paroxysmal atrial fibrillation -rate is controlled, continue  with telemetry COPD- continue medical management and  supportive care   I have personally seen and evaluated the patient, evaluated laboratory and imaging results, formulated the assessment and plan and placed orders. The Patient requires high complexity decision making with multiple systems involvement.  Rounds were done with the Respiratory Therapy Director and Staff therapists and discussed with nursing staff also.  Yevonne Pax, MD [facility] Pulmonary Critical Care Medicine Sleep Medicine

## 2021-07-18 DIAGNOSIS — J9621 Acute and chronic respiratory failure with hypoxia: Secondary | ICD-10-CM | POA: Diagnosis not present

## 2021-07-18 DIAGNOSIS — J449 Chronic obstructive pulmonary disease, unspecified: Secondary | ICD-10-CM | POA: Diagnosis not present

## 2021-07-18 DIAGNOSIS — I5032 Chronic diastolic (congestive) heart failure: Secondary | ICD-10-CM | POA: Diagnosis not present

## 2021-07-18 DIAGNOSIS — J189 Pneumonia, unspecified organism: Secondary | ICD-10-CM | POA: Diagnosis not present

## 2021-07-18 LAB — BASIC METABOLIC PANEL
Anion gap: 11 (ref 5–15)
BUN: 88 mg/dL — ABNORMAL HIGH (ref 8–23)
CO2: 26 mmol/L (ref 22–32)
Calcium: 8.6 mg/dL — ABNORMAL LOW (ref 8.9–10.3)
Chloride: 95 mmol/L — ABNORMAL LOW (ref 98–111)
Creatinine, Ser: 3.06 mg/dL — ABNORMAL HIGH (ref 0.44–1.00)
GFR, Estimated: 16 mL/min — ABNORMAL LOW (ref >60)
Glucose, Bld: 121 mg/dL — ABNORMAL HIGH (ref 70–99)
Potassium: 3.6 mmol/L (ref 3.5–5.1)
Sodium: 132 mmol/L — ABNORMAL LOW (ref 135–145)

## 2021-07-18 LAB — C-REACTIVE PROTEIN: CRP: 5.9 mg/dL — ABNORMAL HIGH (ref <1.0)

## 2021-07-18 LAB — CBC
HCT: 23.8 % — ABNORMAL LOW (ref 36.0–46.0)
Hemoglobin: 7.7 g/dL — ABNORMAL LOW (ref 12.0–15.0)
MCH: 29.7 pg (ref 26.0–34.0)
MCHC: 32.4 g/dL (ref 30.0–36.0)
MCV: 91.9 fL (ref 80.0–100.0)
Platelets: 119 10*3/uL — ABNORMAL LOW (ref 150–400)
RBC: 2.59 MIL/uL — ABNORMAL LOW (ref 3.87–5.11)
RDW: 16.2 % — ABNORMAL HIGH (ref 11.5–15.5)
WBC: 13.4 10*3/uL — ABNORMAL HIGH (ref 4.0–10.5)
nRBC: 0 % (ref 0.0–0.2)

## 2021-07-18 LAB — BPAM RBC
Blood Product Expiration Date: 202306142359
ISSUE DATE / TIME: 202305201150
Unit Type and Rh: 5100

## 2021-07-18 LAB — TYPE AND SCREEN
ABO/RH(D): O POS
Antibody Screen: NEGATIVE
Unit division: 0

## 2021-07-18 NOTE — Progress Notes (Addendum)
Pulmonary Critical Care Medicine [facility] [place]   PULMONARY CRITICAL CARE SERVICE  PROGRESS NOTE     Sherry Mcmahon  CWC:[id number]  DOB: 1950/03/23   DOA: 07/12/2021  Referring Physician: Luna Kitchens, MD  HPI: Sherry Mcmahon is a 71 y.o. female being followed for ventilator/airway/oxygen weaning Acute on Chronic Respiratory Failure. Patient seen lying in bed.  Currently remains on full support.  Her Hbg has stabilized and we will move forward with resuming weaning protocol. No acute distress.  Medications: Reviewed on Rounds  Physical Exam:  Vitals: temp 97.5, pulse 84, respirations 22, bp 137/64, sp02 100%  Ventilator Settings AC VC plus, FiO2 50%, rate 16, tidal volume 400, PEEP 5  General: Comfortable at this time Neck: supple Cardiovascular: no malignant arrhythmias Respiratory: Bilaterally clear Skin: no rash seen on limited exam Musculoskeletal: No gross abnormality Psychiatric:unable to assess Neurologic:no involuntary movements         Lab Data:   Basic Metabolic Panel: Recent Labs  Lab 07/12/21 0655 07/13/21 1058 07/15/21 0447 07/18/21 0332  NA 143 144 136 132*  K 4.8 3.9 3.5 3.6  CL 105 104 99 95*  CO2 24 27 25 26   GLUCOSE 112* 148* 112* 121*  BUN 160* 130* 90* 88*  CREATININE 4.46* 3.91* 3.06* 3.06*  CALCIUM 8.7* 8.6* 8.5* 8.6*  MG  --  2.1 1.8  --     ABG: No results for input(s): PHART, PCO2ART, PO2ART, HCO3, O2SAT in the last 168 hours.  Liver Function Tests: No results for input(s): AST, ALT, ALKPHOS, BILITOT, PROT, ALBUMIN in the last 168 hours. No results for input(s): LIPASE, AMYLASE in the last 168 hours. No results for input(s): AMMONIA in the last 168 hours.  CBC: Recent Labs  Lab 07/12/21 0655 07/13/21 1058 07/13/21 2140 07/15/21 0447 07/17/21 0628 07/18/21 0332  WBC 12.6* 12.3*  --  9.6 15.3* 13.4*  HGB 7.4* 6.6* 8.2* 7.8* 6.3* 7.7*  HCT 24.0* 21.9* 26.0* 25.0* 21.1* 23.8*  MCV 93.8 95.2  --  93.3 96.8  91.9  PLT 226 145*  --  105* 104* 119*    Cardiac Enzymes: No results for input(s): CKTOTAL, CKMB, CKMBINDEX, TROPONINI in the last 168 hours.  BNP (last 3 results) No results for input(s): BNP in the last 8760 hours.  ProBNP (last 3 results) No results for input(s): PROBNP in the last 8760 hours.  Radiological Exams: No results found.  Assessment/Plan Active Problems:   Acute on chronic respiratory failure with hypoxia (HCC)   CAP (community acquired pneumonia)   COPD mixed type (HCC)   Chronic diastolic CHF (congestive heart failure) (HCC)   Paroxysmal atrial fibrillation (HCC)   Acute on chronic respiratory failure with hypoxia -we will continue with weaning per protocol. Weaning was held yesterday due to low hemoglobin and transfusion. We will move forward with ATC 2 hour goal today. Community-acquired pneumonia -has been treated with antibiotics. continues to have some hemoptysis ,once the dialysis issues sorted out we will reassess bronchoscopy. We will continue with supportive care. Chronic diastolic heart failure- appears to be compensated at this time ,we will continue with supportive care and follow-up chest x-ray recommended. Paroxysmal atrial fibrillation -rate is controlled, continue with telemetry COPD- continue medical management and  supportive care   I have personally seen and evaluated the patient, evaluated laboratory and imaging results, formulated the assessment and plan and placed orders. The Patient requires high complexity decision making with multiple systems involvement.  Rounds were done with the  Respiratory Therapy Director and Staff therapists and discussed with nursing staff also.  Allyne Gee, MD [facility] Pulmonary Critical Care Medicine Sleep Medicine

## 2021-07-19 DIAGNOSIS — I5032 Chronic diastolic (congestive) heart failure: Secondary | ICD-10-CM | POA: Diagnosis not present

## 2021-07-19 DIAGNOSIS — J189 Pneumonia, unspecified organism: Secondary | ICD-10-CM | POA: Diagnosis not present

## 2021-07-19 DIAGNOSIS — J449 Chronic obstructive pulmonary disease, unspecified: Secondary | ICD-10-CM | POA: Diagnosis not present

## 2021-07-19 DIAGNOSIS — J9621 Acute and chronic respiratory failure with hypoxia: Secondary | ICD-10-CM | POA: Diagnosis not present

## 2021-07-19 LAB — COMPREHENSIVE METABOLIC PANEL
ALT: 20 U/L (ref 0–44)
AST: 17 U/L (ref 15–41)
Albumin: 1.7 g/dL — ABNORMAL LOW (ref 3.5–5.0)
Alkaline Phosphatase: 204 U/L — ABNORMAL HIGH (ref 38–126)
Anion gap: 11 (ref 5–15)
BUN: 105 mg/dL — ABNORMAL HIGH (ref 8–23)
CO2: 26 mmol/L (ref 22–32)
Calcium: 8.9 mg/dL (ref 8.9–10.3)
Chloride: 93 mmol/L — ABNORMAL LOW (ref 98–111)
Creatinine, Ser: 3.41 mg/dL — ABNORMAL HIGH (ref 0.44–1.00)
GFR, Estimated: 14 mL/min — ABNORMAL LOW (ref >60)
Glucose, Bld: 107 mg/dL — ABNORMAL HIGH (ref 70–99)
Potassium: 4 mmol/L (ref 3.5–5.1)
Sodium: 130 mmol/L — ABNORMAL LOW (ref 135–145)
Total Bilirubin: 1 mg/dL (ref 0.3–1.2)
Total Protein: 5.9 g/dL — ABNORMAL LOW (ref 6.5–8.1)

## 2021-07-19 LAB — CBC
HCT: 25 % — ABNORMAL LOW (ref 36.0–46.0)
Hemoglobin: 8 g/dL — ABNORMAL LOW (ref 12.0–15.0)
MCH: 29.2 pg (ref 26.0–34.0)
MCHC: 32 g/dL (ref 30.0–36.0)
MCV: 91.2 fL (ref 80.0–100.0)
Platelets: 150 10*3/uL (ref 150–400)
RBC: 2.74 MIL/uL — ABNORMAL LOW (ref 3.87–5.11)
RDW: 15.8 % — ABNORMAL HIGH (ref 11.5–15.5)
WBC: 14.2 10*3/uL — ABNORMAL HIGH (ref 4.0–10.5)
nRBC: 0 % (ref 0.0–0.2)

## 2021-07-19 LAB — C-REACTIVE PROTEIN: CRP: 6.4 mg/dL — ABNORMAL HIGH (ref <1.0)

## 2021-07-19 MED ORDER — CEFAZOLIN SODIUM-DEXTROSE 2-4 GM/100ML-% IV SOLN: 2.0000 g | INTRAVENOUS | Status: AC

## 2021-07-19 NOTE — Progress Notes (Cosign Needed)
Pulmonary Critical Care Medicine [facility] [place]   PULMONARY CRITICAL CARE SERVICE  PROGRESS NOTE     Sherry Mcmahon  SHF:[id number]  DOB: [birth year]   DOA: Jul 11, 2021  Referring Physician: Luna Kitchens, MD  HPI: Sherry Mcmahon is a 71 y.o. female being followed for ventilator/airway/oxygen weaning Acute on Chronic Respiratory Failure.  Patient seen lying in bed, currently on full support.  Apparently she was vomiting this morning.  She unfortunately did fail her weaning yesterday and today when they attempted her on pressure support she had low volumes, resulting to be placed back on full support.  She is tearful this morning.  No acute distress.  Medications: Reviewed on Rounds  Physical Exam:  Vitals: Temp 99.0, pulse 84, respirations 23, BP 126/67, SPO2 96%  Ventilator Settings AC VC plus, FiO2 40%, rate 16, tidal volume 400, PEEP 5  General: Comfortable at this time Neck: supple Cardiovascular: no malignant arrhythmias Respiratory: Right lung clear, left lung coarse Skin: no rash seen on limited exam Musculoskeletal: No gross abnormality Psychiatric:unable to assess Neurologic:no involuntary movements         Lab Data:   Basic Metabolic Panel: Recent Labs  Lab 07/13/21 1058 07/15/21 0447 07/18/21 0332 07/19/21 0523  NA 144 136 132* 130*  K 3.9 3.5 3.6 4.0  CL 104 99 95* 93*  CO2 27 25 26 26   GLUCOSE 148* 112* 121* 107*  BUN 130* 90* 88* 105*  CREATININE 3.91* 3.06* 3.06* 3.41*  CALCIUM 8.6* 8.5* 8.6* 8.9  MG 2.1 1.8  --   --     ABG: No results for input(s): PHART, PCO2ART, PO2ART, HCO3, O2SAT in the last 168 hours.  Liver Function Tests: Recent Labs  Lab 07/19/21 0523  AST 17  ALT 20  ALKPHOS 204*  BILITOT 1.0  PROT 5.9*  ALBUMIN 1.7*   No results for input(s): LIPASE, AMYLASE in the last 168 hours. No results for input(s): AMMONIA in the last 168 hours.  CBC: Recent Labs  Lab 07/13/21 1058 07/13/21 2140 07/15/21 0447  07/17/21 0628 07/18/21 0332 07/19/21 0523  WBC 12.3*  --  9.6 15.3* 13.4* 14.2*  HGB 6.6* 8.2* 7.8* 6.3* 7.7* 8.0*  HCT 21.9* 26.0* 25.0* 21.1* 23.8* 25.0*  MCV 95.2  --  93.3 96.8 91.9 91.2  PLT 145*  --  105* 104* 119* 150    Cardiac Enzymes: No results for input(s): CKTOTAL, CKMB, CKMBINDEX, TROPONINI in the last 168 hours.  BNP (last 3 results) No results for input(s): BNP in the last 8760 hours.  ProBNP (last 3 results) No results for input(s): PROBNP in the last 8760 hours.  Radiological Exams: No results found.  Assessment/Plan Active Problems:   Acute on chronic respiratory failure with hypoxia (HCC)   CAP (community acquired pneumonia)   COPD mixed type (HCC)   Chronic diastolic CHF (congestive heart failure) (HCC)   Paroxysmal atrial fibrillation (HCC)   Acute on chronic respiratory failure with hypoxia -we will continue with weaning per protocol.  She unfortunately failed her weaning trials yesterday.  This morning when she was placed on pressure support she had low volumes and had to be placed back on full support.  We will continue with pulmonary toilet. Status post tracheostomy-remains in place.  Continue trach care per protocol. Community-acquired pneumonia -has been treated with antibiotics. continues to have some hemoptysis ,once the dialysis issues sorted out we will reassess bronchoscopy. We will continue with supportive care. Chronic diastolic heart failure- appears to be  compensated at this time ,we will continue with supportive care and follow-up chest x-ray recommended. Paroxysmal atrial fibrillation -rate is controlled, continue with telemetry COPD- continue medical management and  supportive care   I have personally seen and evaluated the patient, evaluated laboratory and imaging results, formulated the assessment and plan and placed orders. The Patient requires high complexity decision making with multiple systems involvement.  Rounds were done with  the Respiratory Therapy Director and Staff therapists and discussed with nursing staff also.  Yevonne Pax, MD [facility] Pulmonary Critical Care Medicine Sleep Medicine

## 2021-07-19 NOTE — Progress Notes (Signed)
[facility]  ROUNDING NOTE   Subjective:  Patient due for hemodialysis treatment later today. Still on the ventilator this AM. Remains critically ill.   Objective:  Vital signs in last 24 hours:  Temperature 99 pulse 84 respirations 23 blood pressure 126/51  Physical Exam: General: Critically ill-appearing  Head: Normocephalic, atraumatic. Moist oral mucosal membranes  Eyes: Anicteric  Neck: Tracheostomy in place  Lungs:  Coarse breath sounds bilateral, vent assisted  Heart: S1S2 no rubs  Abdomen:  Soft, nontender, bowel sounds present, PEG tube in place  Extremities: 1+ peripheral edema.  Neurologic: Arousable, will follow simple commands  Skin: No acute rash  Access: Right IJ temporary dialysis catheter    Basic Metabolic Panel: Recent Labs  Lab 07/13/21 1058 07/15/21 0447 07/18/21 0332 07/19/21 0523  NA 144 136 132* 130*  K 3.9 3.5 3.6 4.0  CL 104 99 95* 93*  CO2 27 25 26 26   GLUCOSE 148* 112* 121* 107*  BUN 130* 90* 88* 105*  CREATININE 3.91* 3.06* 3.06* 3.41*  CALCIUM 8.6* 8.5* 8.6* 8.9  MG 2.1 1.8  --   --      Liver Function Tests: Recent Labs  Lab 07/19/21 0523  AST 17  ALT 20  ALKPHOS 204*  BILITOT 1.0  PROT 5.9*  ALBUMIN 1.7*   No results for input(s): LIPASE, AMYLASE in the last 168 hours. No results for input(s): AMMONIA in the last 168 hours.  CBC: Recent Labs  Lab 07/13/21 1058 07/13/21 2140 07/15/21 0447 07/17/21 0628 07/18/21 0332 07/19/21 0523  WBC 12.3*  --  9.6 15.3* 13.4* 14.2*  HGB 6.6* 8.2* 7.8* 6.3* 7.7* 8.0*  HCT 21.9* 26.0* 25.0* 21.1* 23.8* 25.0*  MCV 95.2  --  93.3 96.8 91.9 91.2  PLT 145*  --  105* 104* 119* 150     Cardiac Enzymes: No results for input(s): CKTOTAL, CKMB, CKMBINDEX, TROPONINI in the last 168 hours.  BNP: Invalid input(s): POCBNP  CBG: No results for input(s): GLUCAP in the last 168 hours.  Microbiology: Results for orders placed or performed during the hospital encounter  of 06/27/2021  Culture, Respiratory w Gram Stain     Status: None   Collection Time: 07/01/21  7:16 PM   Specimen: Tracheal Aspirate; Respiratory  Result Value Ref Range Status   Specimen Description TRACHEAL ASPIRATE  Final   Special Requests Normal  Final   Gram Stain   Final    ABUNDANT NO YEAST OR FUNGAL ELEMENTS SEEN FEW GRAM NEGATIVE RODS Performed at [facility], [address], [place], [place] [address]    Culture MODERATE PSEUDOMONAS AERUGINOSA  Final   Report Status 07/04/2021 FINAL  Final   Organism ID, Bacteria PSEUDOMONAS AERUGINOSA  Final      Susceptibility   Pseudomonas aeruginosa - MIC*    CEFTAZIDIME 32 RESISTANT Resistant     CIPROFLOXACIN <=0.25 SENSITIVE Sensitive     GENTAMICIN <=1 SENSITIVE Sensitive     IMIPENEM 2 SENSITIVE Sensitive     * MODERATE PSEUDOMONAS AERUGINOSA  Culture, Respiratory w Gram Stain     Status: None   Collection Time: 07/11/21  4:30 PM   Specimen: Tracheal Aspirate  Result Value Ref Range Status   Specimen Description TRACHEAL ASPIRATE  Final   Special Requests NONE  Final   Gram Stain   Final    MODERATE WBC PRESENT,BOTH PMN AND MONONUCLEAR FEW GRAM NEGATIVE RODS    Culture   Final    FEW PSEUDOMONAS AERUGINOSA  NO STAPHYLOCOCCUS AUREUS ISOLATED Performed at [facility], [address], [place], [place] [address]    Report Status 07/13/2021 FINAL  Final   Organism ID, Bacteria PSEUDOMONAS AERUGINOSA  Final      Susceptibility   Pseudomonas aeruginosa - MIC*    CEFTAZIDIME 16 INTERMEDIATE Intermediate     CIPROFLOXACIN <=0.25 SENSITIVE Sensitive     GENTAMICIN <=1 SENSITIVE Sensitive     IMIPENEM 2 SENSITIVE Sensitive     * FEW PSEUDOMONAS AERUGINOSA    Coagulation Studies: No results for input(s): LABPROT, INR in the last 72 hours.  Urinalysis: No results for input(s): COLORURINE, LABSPEC, PHURINE, GLUCOSEU, HGBUR, BILIRUBINUR, KETONESUR, PROTEINUR, UROBILINOGEN, NITRITE, LEUKOCYTESUR in the last 72  hours.  Invalid input(s): APPERANCEUR    Imaging: No results found.   Medications:     heparin sodium (porcine), lidocaine, lidocaine  Assessment/ Plan:  71 y.o. female  with a PMHx of COPD, longstanding alcohol abuse, paroxysmal atrial fibrillation, history of pacemaker placement, chronic diastolic heart failure, longstanding tobacco abuse, hypertension, spinal stenosis, osteopenia, depression, history of intracranial injury status post surgical intervention, HSV meningitis, acute respiratory failure, who was admitted to [facility] on 06/22/2021 for ongoing treatment.    1.  Acute kidney injury.  Appears to be multifactorial but most likely due to ATN.  Renal ultrasound was negative for obstruction.   -Patient due for hemodialysis treatment again later this AM.  BUN up to 105 with a creatinine of 3.41.  Remains dialysis dependent at the moment.  2.  Hyperkalemia.  Potassium 4.0 and acceptable this AM.  Continue to monitor.  3.  Acute respiratory failure.  Patient seen and evaluated at bedside.  Continues to require vent support.  Weaning as per pulmonary/critical care.   LOS: 0 Brianda Beitler 5/22/20238:42 AM

## 2021-07-19 NOTE — Progress Notes (Signed)
   Plans for tunneled dialysis catheter placement in IR 5/23  New orders placed Consent signed and in IR

## 2021-07-20 DIAGNOSIS — I5032 Chronic diastolic (congestive) heart failure: Secondary | ICD-10-CM | POA: Diagnosis not present

## 2021-07-20 DIAGNOSIS — J9621 Acute and chronic respiratory failure with hypoxia: Secondary | ICD-10-CM | POA: Diagnosis not present

## 2021-07-20 DIAGNOSIS — J189 Pneumonia, unspecified organism: Secondary | ICD-10-CM | POA: Diagnosis not present

## 2021-07-20 DIAGNOSIS — J449 Chronic obstructive pulmonary disease, unspecified: Secondary | ICD-10-CM | POA: Diagnosis not present

## 2021-07-20 LAB — CBC
HCT: 25.1 % — ABNORMAL LOW (ref 36.0–46.0)
Hemoglobin: 7.6 g/dL — ABNORMAL LOW (ref 12.0–15.0)
MCH: 28.5 pg (ref 26.0–34.0)
MCHC: 30.3 g/dL (ref 30.0–36.0)
MCV: 94 fL (ref 80.0–100.0)
Platelets: 167 10*3/uL (ref 150–400)
RBC: 2.67 MIL/uL — ABNORMAL LOW (ref 3.87–5.11)
RDW: 15.5 % (ref 11.5–15.5)
WBC: 12.5 10*3/uL — ABNORMAL HIGH (ref 4.0–10.5)
nRBC: 0 % (ref 0.0–0.2)

## 2021-07-20 LAB — HEPARIN INDUCED PLATELET AB (HIT ANTIBODY)

## 2021-07-20 NOTE — Progress Notes (Cosign Needed)
Pulmonary Critical Care Medicine [place]   PULMONARY CRITICAL CARE SERVICE  PROGRESS NOTE     DOREEN PILARCZYK  [id number]  DOB: 11-29-1950   DOA: 05/29/2021  Referring Physician: Satira Sark, MD  HPI: Sherry Mcmahon is a 71 y.o. female being followed for ventilator/airway/oxygen weaning Acute on Chronic Respiratory Failure.  Patient seen lying in bed, currently remains on full support.  She is working with physical therapy this morning.  Unfortunately she continues to fail her weaning trials.  I did speak with internal medicine and it is likely the family is going to move forward with comfort care this afternoon.  Patient is in no acute distress but she is very tearful this morning.  No acute overnight events.  Medications: Reviewed on Rounds  Physical Exam:  Vitals: Temp 98.0, pulse 79, respirations 19, BP 128/75, SPO2 99%  Ventilator Settings AC VC FiO2 40%, rate 16, tidal volume 400, PEEP 5  General: Comfortable at this time Neck: supple Cardiovascular: no malignant arrhythmias Respiratory: Bilaterally coarse Skin: no rash seen on limited exam Musculoskeletal: No gross abnormality Psychiatric:unable to assess Neurologic:no involuntary movements         Lab Data:   Basic Metabolic Panel: Recent Labs  Lab 07/13/21 1058 07/15/21 0447 07/18/21 0332 07/19/21 0523  NA 144 136 132* 130*  K 3.9 3.5 3.6 4.0  CL 104 99 95* 93*  CO2 27 25 26 26   GLUCOSE 148* 112* 121* 107*  BUN 130* 90* 88* 105*  CREATININE 3.91* 3.06* 3.06* 3.41*  CALCIUM 8.6* 8.5* 8.6* 8.9  MG 2.1 1.8  --   --     ABG: No results for input(s): PHART, PCO2ART, PO2ART, HCO3, O2SAT in the last 168 hours.  Liver Function Tests: Recent Labs  Lab 07/19/21 0523  AST 17  ALT 20  ALKPHOS 204*  BILITOT 1.0  PROT 5.9*  ALBUMIN 1.7*   No results for input(s): LIPASE, AMYLASE in the last 168 hours. No results for input(s): AMMONIA in the last 168 hours.  CBC: Recent Labs   Lab 07/15/21 0447 07/17/21 0628 07/18/21 0332 07/19/21 0523 07/20/21 0726  WBC 9.6 15.3* 13.4* 14.2* 12.5*  HGB 7.8* 6.3* 7.7* 8.0* 7.6*  HCT 25.0* 21.1* 23.8* 25.0* 25.1*  MCV 93.3 96.8 91.9 91.2 94.0  PLT 105* 104* 119* 150 167    Cardiac Enzymes: No results for input(s): CKTOTAL, CKMB, CKMBINDEX, TROPONINI in the last 168 hours.  BNP (last 3 results) No results for input(s): BNP in the last 8760 hours.  ProBNP (last 3 results) No results for input(s): PROBNP in the last 8760 hours.  Radiological Exams: No results found.  Assessment/Plan Active Problems:   Acute on chronic respiratory failure with hypoxia (HCC)   CAP (community acquired pneumonia)   COPD mixed type ([place])   Chronic diastolic CHF (congestive heart failure) (HCC)   Paroxysmal atrial fibrillation (HCC)  Acute on chronic respiratory failure with hypoxia -we will continue with weaning per protocol.  She unfortunately failed her weaning trials again yesterday.  We will continue with pulmonary toilet. Status post tracheostomy-remains in place.  Continue trach care per protocol. Community-acquired pneumonia -has been treated with antibiotics. continues to have some hemoptysis ,once the dialysis issues sorted out we will reassess bronchoscopy. We will continue with supportive care. Chronic diastolic heart failure- appears to be compensated at this time ,we will continue with supportive care and follow-up chest x-ray recommended. Paroxysmal atrial fibrillation -rate is controlled, continue with telemetry COPD- continue  medical management and  supportive care   I have personally seen and evaluated the patient, evaluated laboratory and imaging results, formulated the assessment and plan and placed orders. The Patient requires high complexity decision making with multiple systems involvement.  Rounds were done with the Respiratory Therapy Director and Staff therapists and discussed with nursing staff also.  Allyne Gee, MD [facility] Pulmonary Critical Care Medicine Sleep Medicine

## 2021-07-21 DIAGNOSIS — J9621 Acute and chronic respiratory failure with hypoxia: Secondary | ICD-10-CM | POA: Diagnosis not present

## 2021-07-21 DIAGNOSIS — J189 Pneumonia, unspecified organism: Secondary | ICD-10-CM | POA: Diagnosis not present

## 2021-07-21 DIAGNOSIS — J449 Chronic obstructive pulmonary disease, unspecified: Secondary | ICD-10-CM | POA: Diagnosis not present

## 2021-07-21 DIAGNOSIS — I5032 Chronic diastolic (congestive) heart failure: Secondary | ICD-10-CM | POA: Diagnosis not present

## 2021-07-21 LAB — CBC
HCT: 25.3 % — ABNORMAL LOW (ref 36.0–46.0)
Hemoglobin: 7.9 g/dL — ABNORMAL LOW (ref 12.0–15.0)
MCH: 29.2 pg (ref 26.0–34.0)
MCHC: 31.2 g/dL (ref 30.0–36.0)
MCV: 93.4 fL (ref 80.0–100.0)
Platelets: 204 10*3/uL (ref 150–400)
RBC: 2.71 MIL/uL — ABNORMAL LOW (ref 3.87–5.11)
RDW: 15.4 % (ref 11.5–15.5)
WBC: 11.9 10*3/uL — ABNORMAL HIGH (ref 4.0–10.5)
nRBC: 0 % (ref 0.0–0.2)

## 2021-07-21 LAB — RENAL FUNCTION PANEL
Albumin: 1.9 g/dL — ABNORMAL LOW (ref 3.5–5.0)
Anion gap: 9 (ref 5–15)
BUN: 72 mg/dL — ABNORMAL HIGH (ref 8–23)
CO2: 27 mmol/L (ref 22–32)
Calcium: 8.8 mg/dL — ABNORMAL LOW (ref 8.9–10.3)
Chloride: 94 mmol/L — ABNORMAL LOW (ref 98–111)
Creatinine, Ser: 2.69 mg/dL — ABNORMAL HIGH (ref 0.44–1.00)
GFR, Estimated: 19 mL/min — ABNORMAL LOW (ref >60)
Glucose, Bld: 99 mg/dL (ref 70–99)
Phosphorus: 4.6 mg/dL (ref 2.5–4.6)
Potassium: 4.4 mmol/L (ref 3.5–5.1)
Sodium: 130 mmol/L — ABNORMAL LOW (ref 135–145)

## 2021-07-21 NOTE — Progress Notes (Cosign Needed)
Pulmonary Critical Care Medicine [facility]   PULMONARY CRITICAL CARE SERVICE  PROGRESS NOTE     Sherry Mcmahon  ENI:[id number]  DOB: [birth year]   DOA: 2021-07-14  Referring Physician: Luna Kitchens, MD  HPI: Sherry Mcmahon is a 71 y.o. female being followed for ventilator/airway/oxygen weaning Acute on Chronic Respiratory Failure.  Patient seen lying in bed, continues to be on full support.  Unfortunately continues to fail her weaning.  Per nursing staff is likely patient is going to be placed on comfort care today.  No acute distress, no acute overnight events.  Medications: Reviewed on Rounds  Physical Exam:  Vitals: Temp 97.2, pulse 80, respirations 25, BP 121/66, SPO2 96%  Ventilator Settings AC VC, FiO2 40%, rate 16, tidal volume 400, PEEP 5  General: Comfortable at this time Neck: supple Cardiovascular: no malignant arrhythmias Respiratory: Bilaterally clear Skin: no rash seen on limited exam Musculoskeletal: No gross abnormality Psychiatric:unable to assess Neurologic:no involuntary movements         Lab Data:   Basic Metabolic Panel: Recent Labs  Lab 07/15/21 0447 07/18/21 0332 07/19/21 0523 07/21/21 0534  NA 136 132* 130* 130*  K 3.5 3.6 4.0 4.4  CL 99 95* 93* 94*  CO2 25 26 26 27   GLUCOSE 112* 121* 107* 99  BUN 90* 88* 105* 72*  CREATININE 3.06* 3.06* 3.41* 2.69*  CALCIUM 8.5* 8.6* 8.9 8.8*  MG 1.8  --   --   --   PHOS  --   --   --  4.6    ABG: No results for input(s): PHART, PCO2ART, PO2ART, HCO3, O2SAT in the last 168 hours.  Liver Function Tests: Recent Labs  Lab 07/19/21 0523 07/21/21 0534  AST 17  --   ALT 20  --   ALKPHOS 204*  --   BILITOT 1.0  --   PROT 5.9*  --   ALBUMIN 1.7* 1.9*   No results for input(s): LIPASE, AMYLASE in the last 168 hours. No results for input(s): AMMONIA in the last 168 hours.  CBC: Recent Labs  Lab 07/17/21 0628 07/18/21 0332 07/19/21 0523 07/20/21 0726 07/21/21 0534  WBC  15.3* 13.4* 14.2* 12.5* 11.9*  HGB 6.3* 7.7* 8.0* 7.6* 7.9*  HCT 21.1* 23.8* 25.0* 25.1* 25.3*  MCV 96.8 91.9 91.2 94.0 93.4  PLT 104* 119* 150 167 204    Cardiac Enzymes: No results for input(s): CKTOTAL, CKMB, CKMBINDEX, TROPONINI in the last 168 hours.  BNP (last 3 results) No results for input(s): BNP in the last 8760 hours.  ProBNP (last 3 results) No results for input(s): PROBNP in the last 8760 hours.  Radiological Exams: No results found.  Assessment/Plan Active Problems:   Acute on chronic respiratory failure with hypoxia (HCC)   CAP (community acquired pneumonia)   COPD mixed type (HCC)   Chronic diastolic CHF (congestive heart failure) (HCC)   Paroxysmal atrial fibrillation (HCC)   Acute on chronic respiratory failure with hypoxia -we will continue with weaning per protocol.  She unfortunately failed her weaning trials again yesterday.  We will continue with pulmonary toilet. Status post tracheostomy-remains in place.  Continue trach care per protocol. Community-acquired pneumonia -has been treated with antibiotics. continues to have some hemoptysis ,once the dialysis issues sorted out we will reassess bronchoscopy. We will continue with supportive care. Chronic diastolic heart failure- appears to be compensated at this time ,we will continue with supportive care and follow-up chest x-ray recommended. Paroxysmal atrial fibrillation -rate is controlled,  continue with telemetry COPD- continue medical management and  supportive care   I have personally seen and evaluated the patient, evaluated laboratory and imaging results, formulated the assessment and plan and placed orders. The Patient requires high complexity decision making with multiple systems involvement.  Rounds were done with the Respiratory Therapy Director and Staff therapists and discussed with nursing staff also.  Yevonne Pax, MD [facility] Pulmonary Critical Care Medicine Sleep Medicine

## 2021-07-29 DEATH — deceased

## 2023-04-19 IMAGING — US US RENAL
1 series · 14 of 20 positions shown · non-contrast
Comparison: None Available.

CLINICAL DATA: Acute kidney injury

EXAM:
RENAL / URINARY TRACT ULTRASOUND COMPLETE

[Series 1: us renal · 14 of 20 slices shown]
[im 1/20]
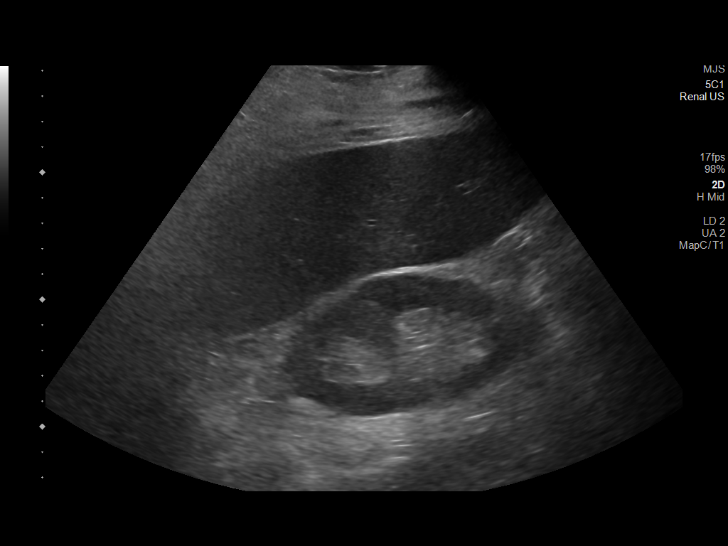
[im 3/20]
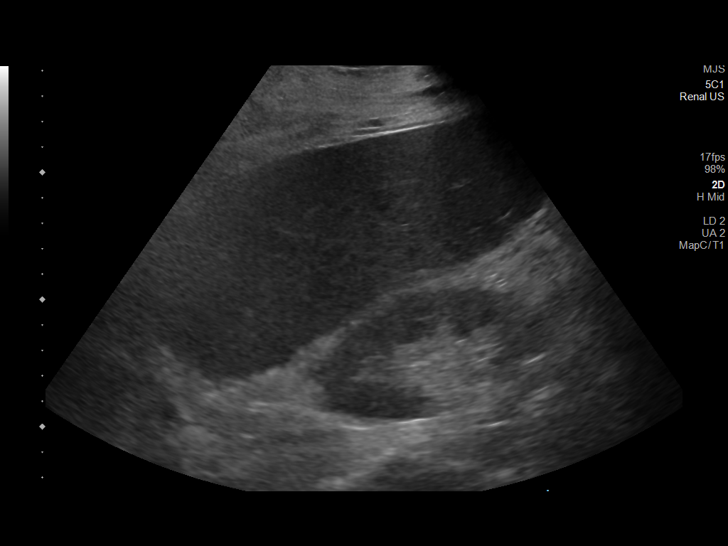
[im 4/20]
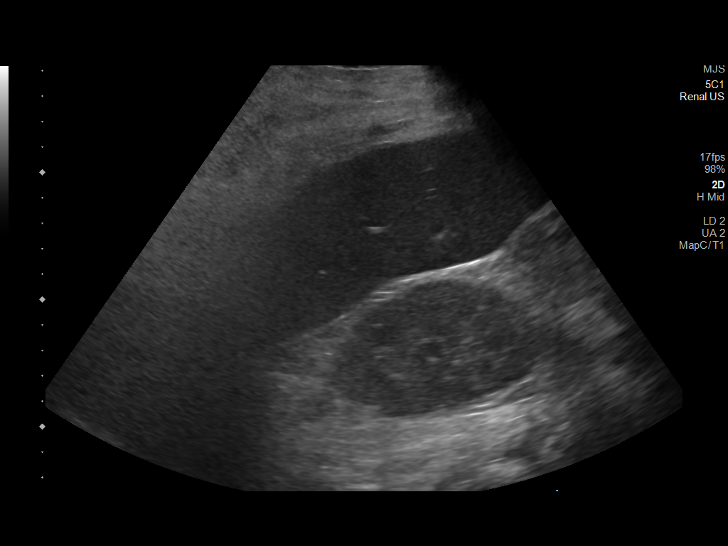
[im 6/20]
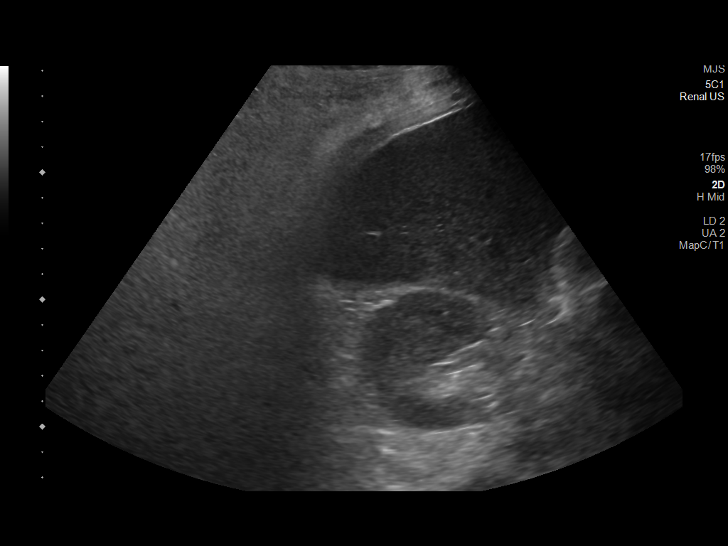
[im 7/20]
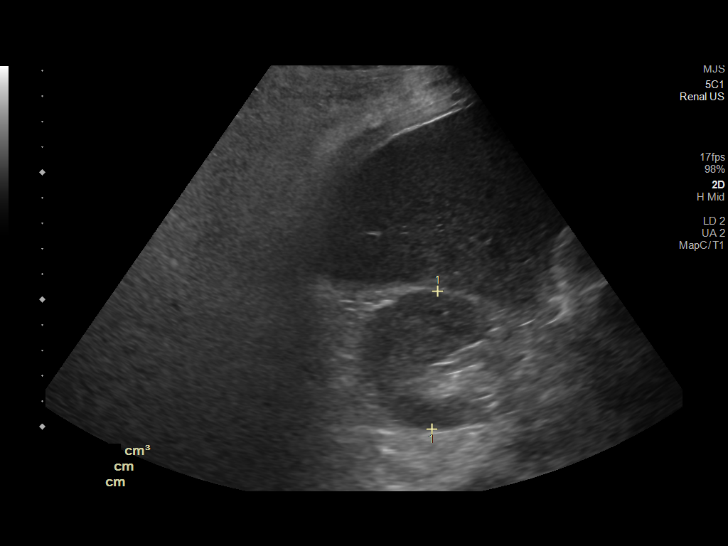
[im 8/20]
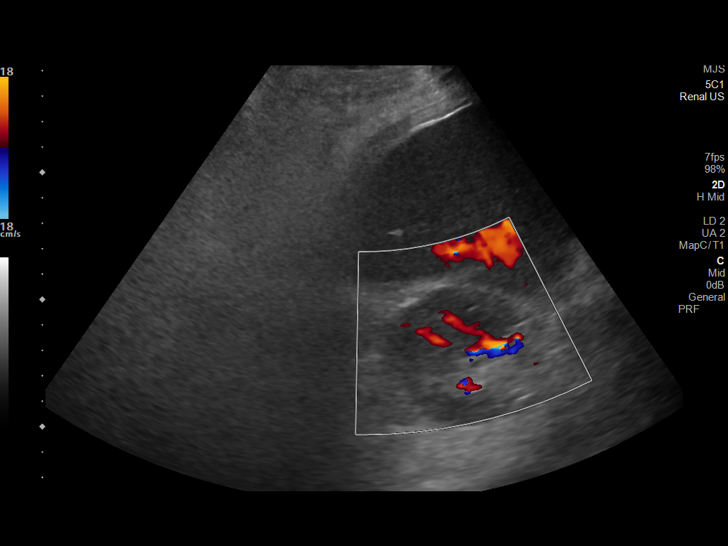
[im 10/20]
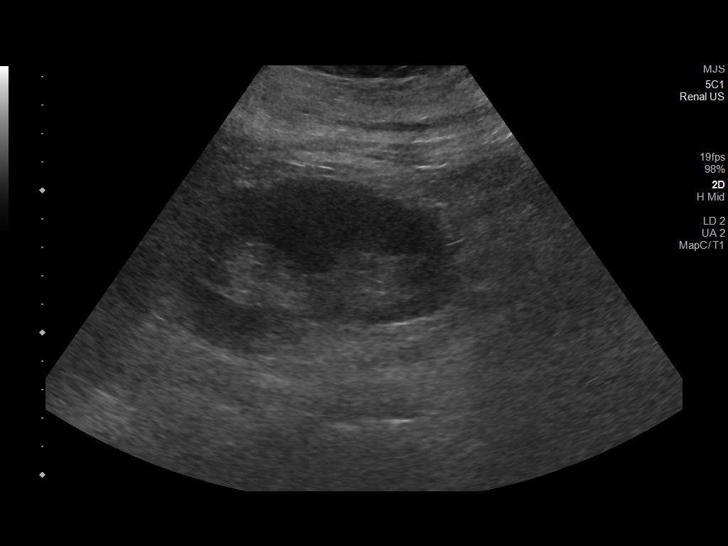
[im 11/20]
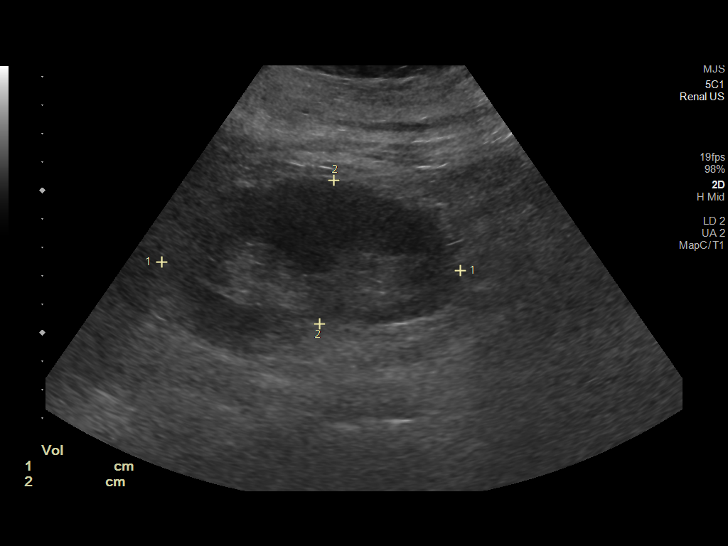
[im 13/20]
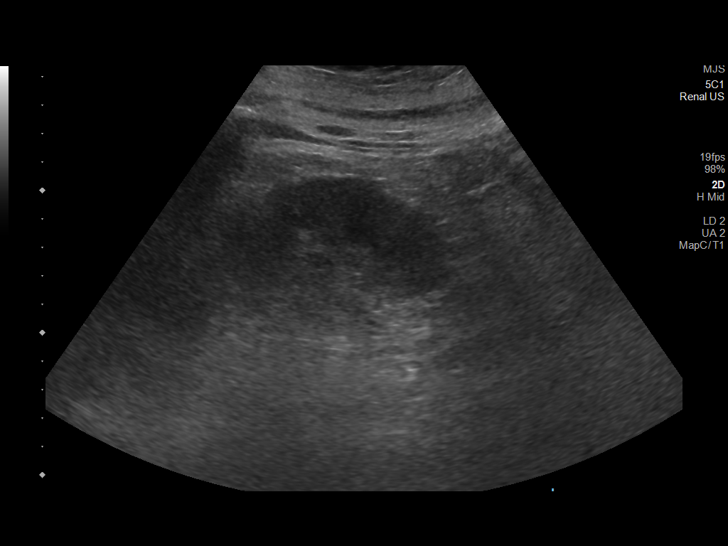
[im 14/20]
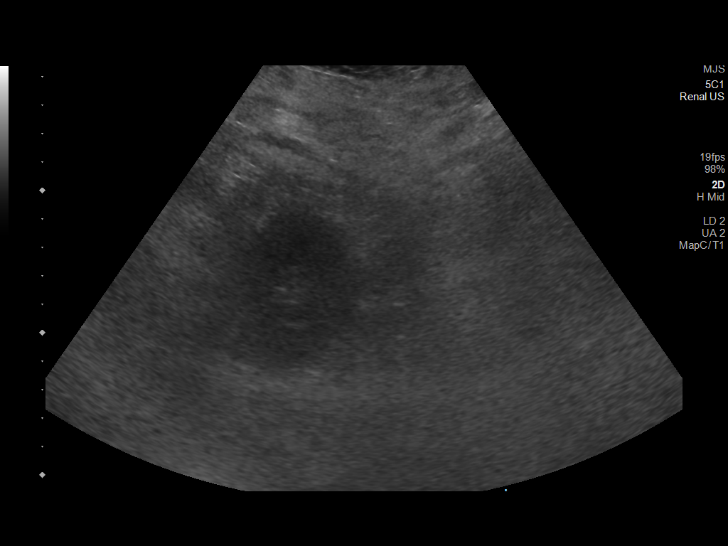
[im 16/20]
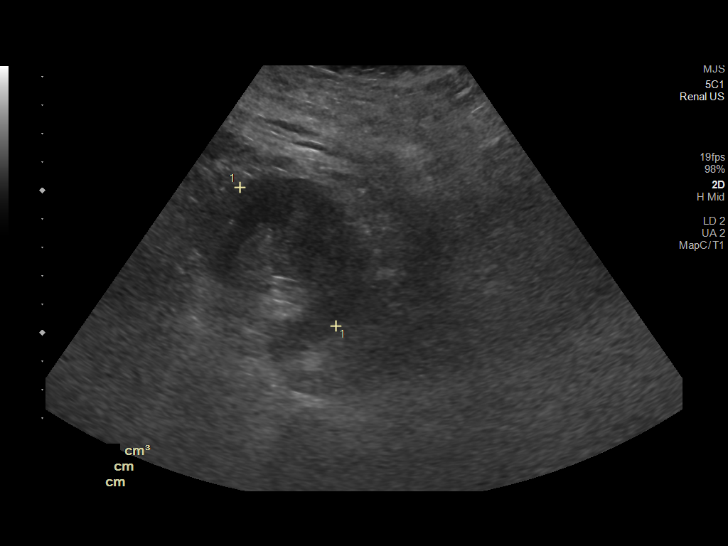
[im 17/20]
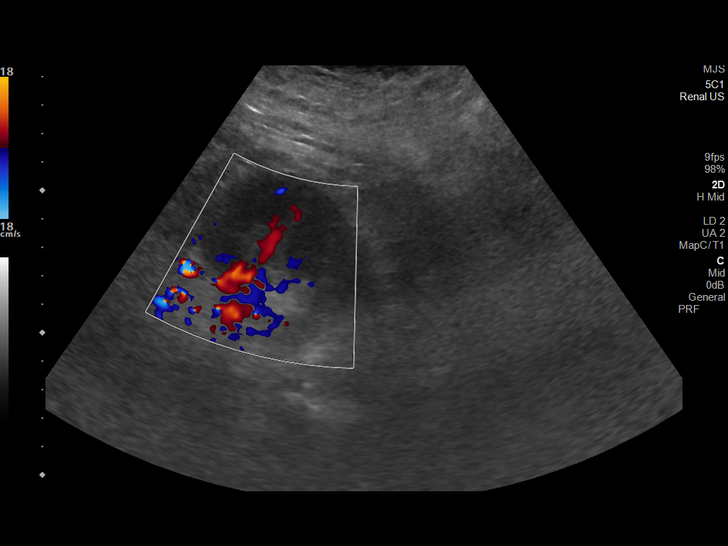
[im 18/20]
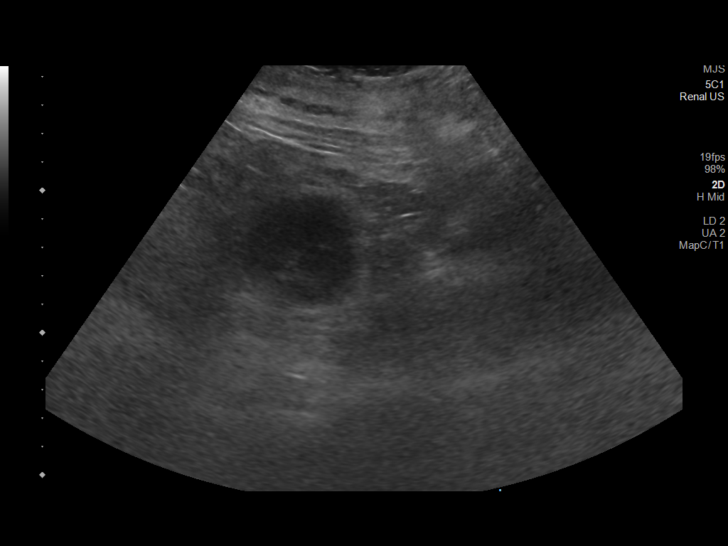
[im 20/20]
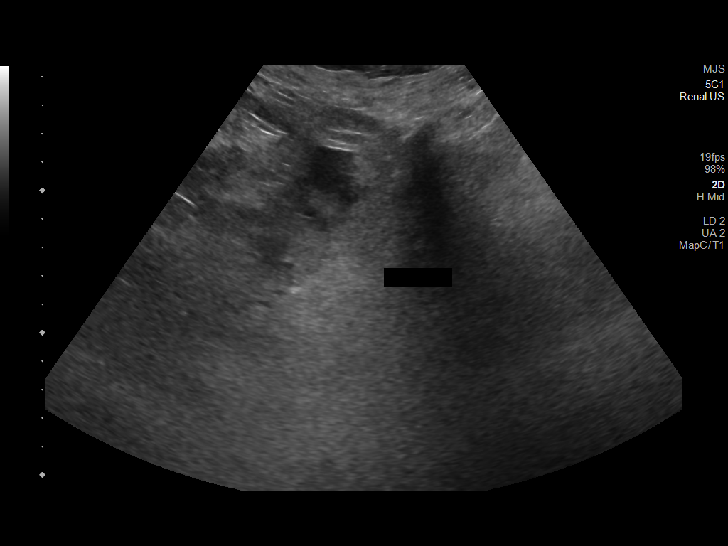

[14 of 20 positions shown; findings below may reference images not displayed]

FINDINGS: Right Kidney:

Renal measurements: 10.2 x 5.5 x 5.4 cm = volume: 158 mL.
Echogenicity within normal limits. No mass or hydronephrosis
visualized.

Left Kidney:

Renal measurements: 10.5 x 5.1 x 5.9 cm = volume: 165 mL.
Echogenicity within normal limits. No mass or hydronephrosis
visualized.

Bladder:

The bladder is empty, limiting evaluation.

Other:

None.
IMPRESSION: Normal renal ultrasound. No etiology seen for the patient's acute
kidney injury.

## 2023-04-20 IMAGING — DX DG ABD PORTABLE 1V
1 series · 2 of 2 positions shown · non-contrast
Comparison: 06/18/2021

CLINICAL DATA: Abdominal pain.

EXAM:
PORTABLE ABDOMEN - 1 VIEW

[Series 1: abdomen · 0.14mm/px · 2 of 2 slices shown]
[im 1/2]
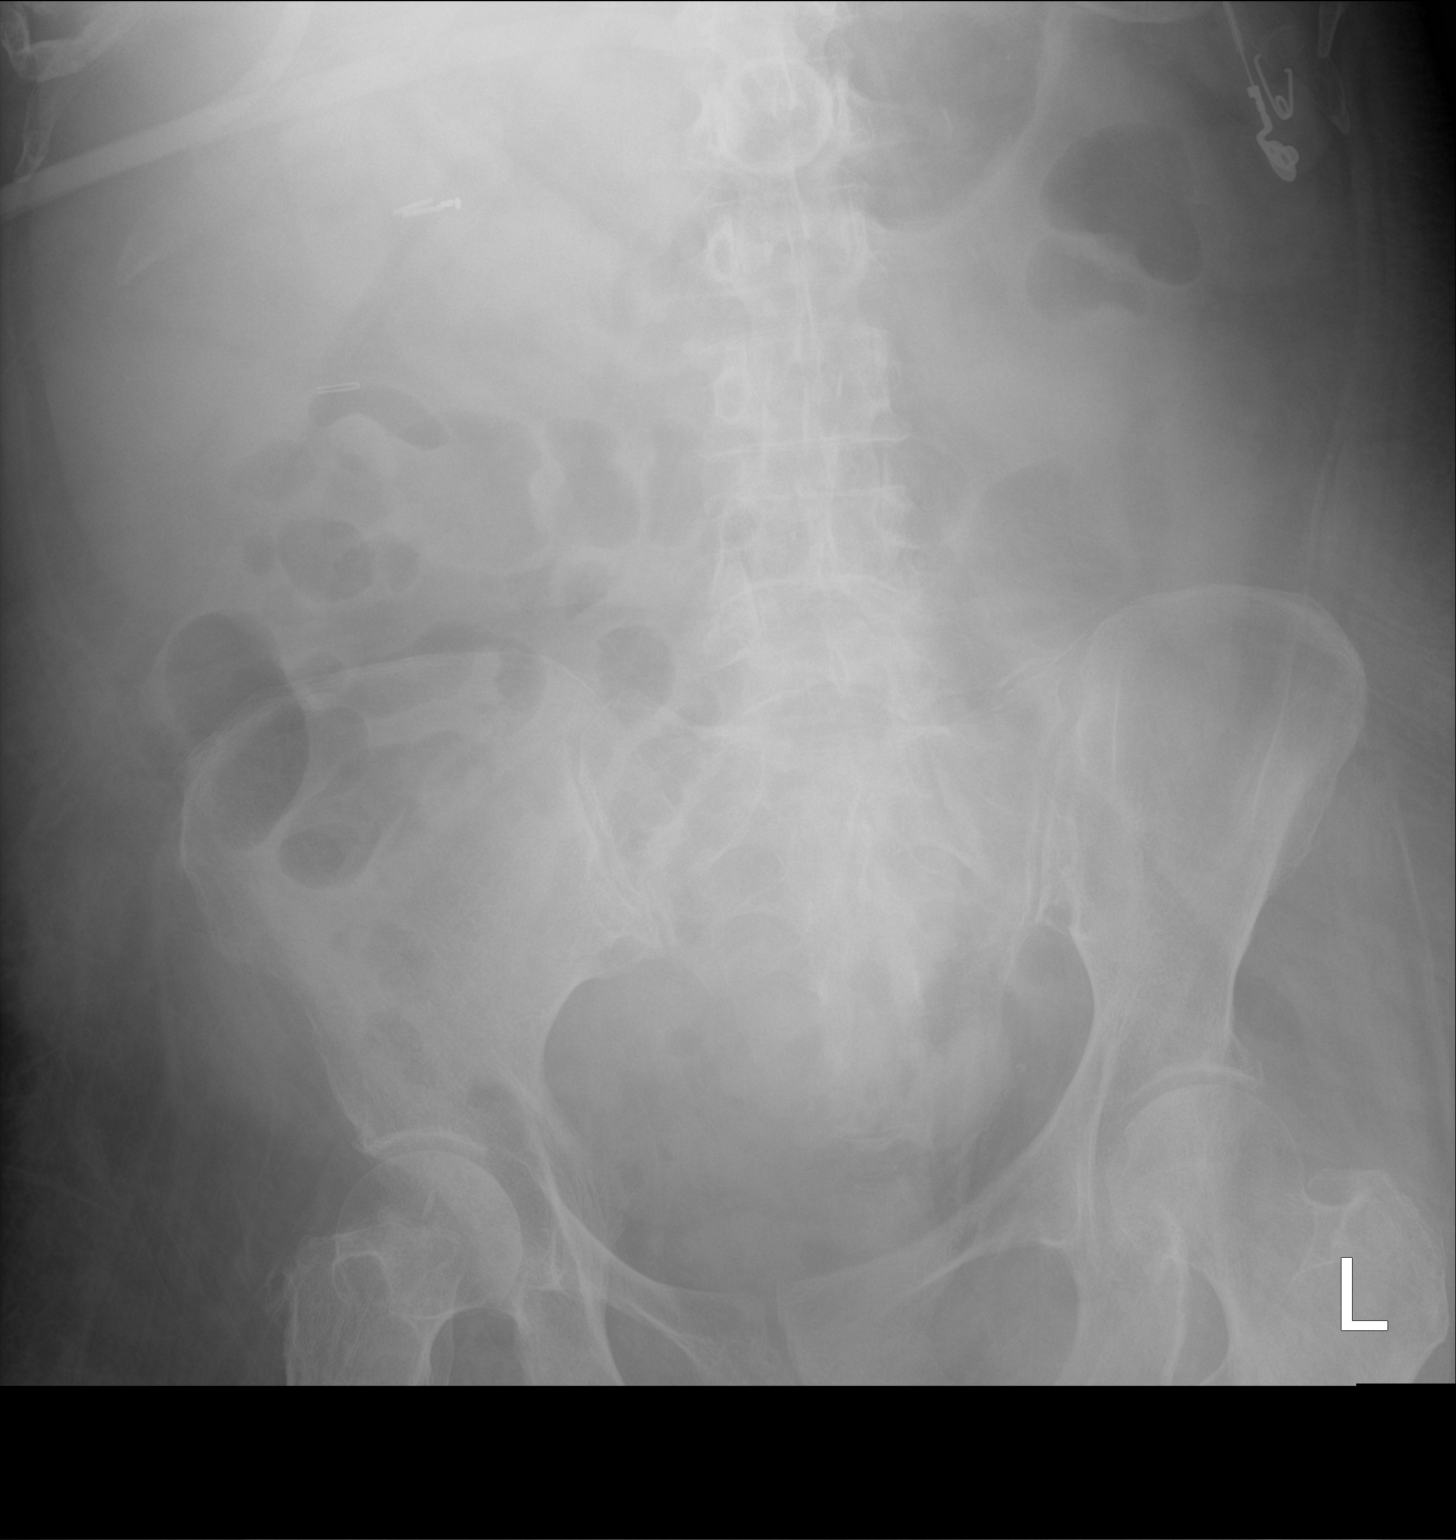
[im 2/2]
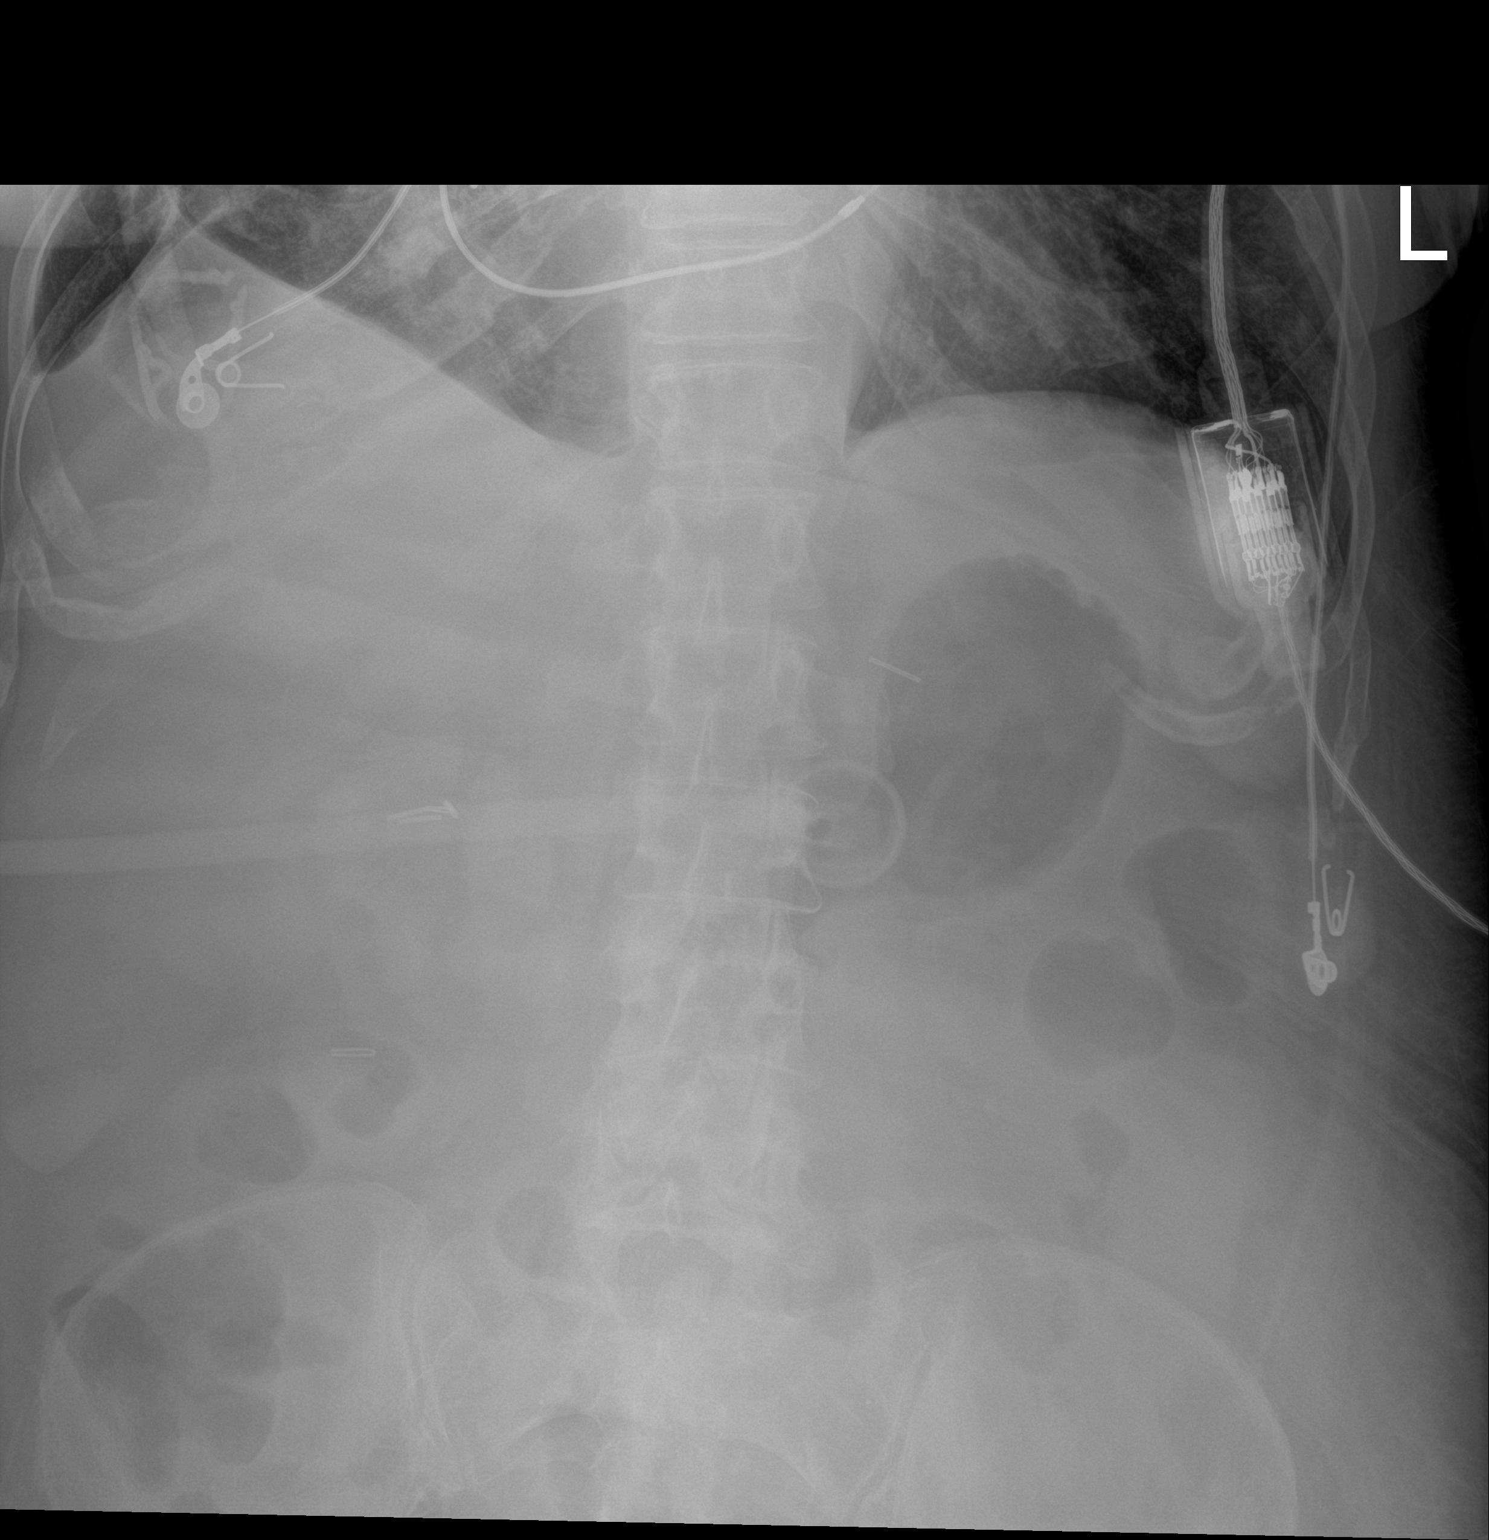

[2 of 2 positions shown; findings below may reference images not displayed]

FINDINGS: Feeding gastrostomy tube appears to be in good position overlying
the body region of the stomach.

The bowel gas pattern is unremarkable. No findings to suggest
obstruction or perforation.
IMPRESSION: Feeding gastrostomy tube in good position.

Unremarkable bowel gas pattern.

## 2023-04-22 IMAGING — US IR FLUORO GUIDE CV LINE*R*
1 series · 1 of 1 positions shown · non-contrast
Comparison: none

INDICATION: End-stage renal disease, requiring a temporary dialysis catheter for
hemodialysis

[Series 1: ir fluoro/shunt/fist · 1 of 1 slices shown]
[im 1/1]
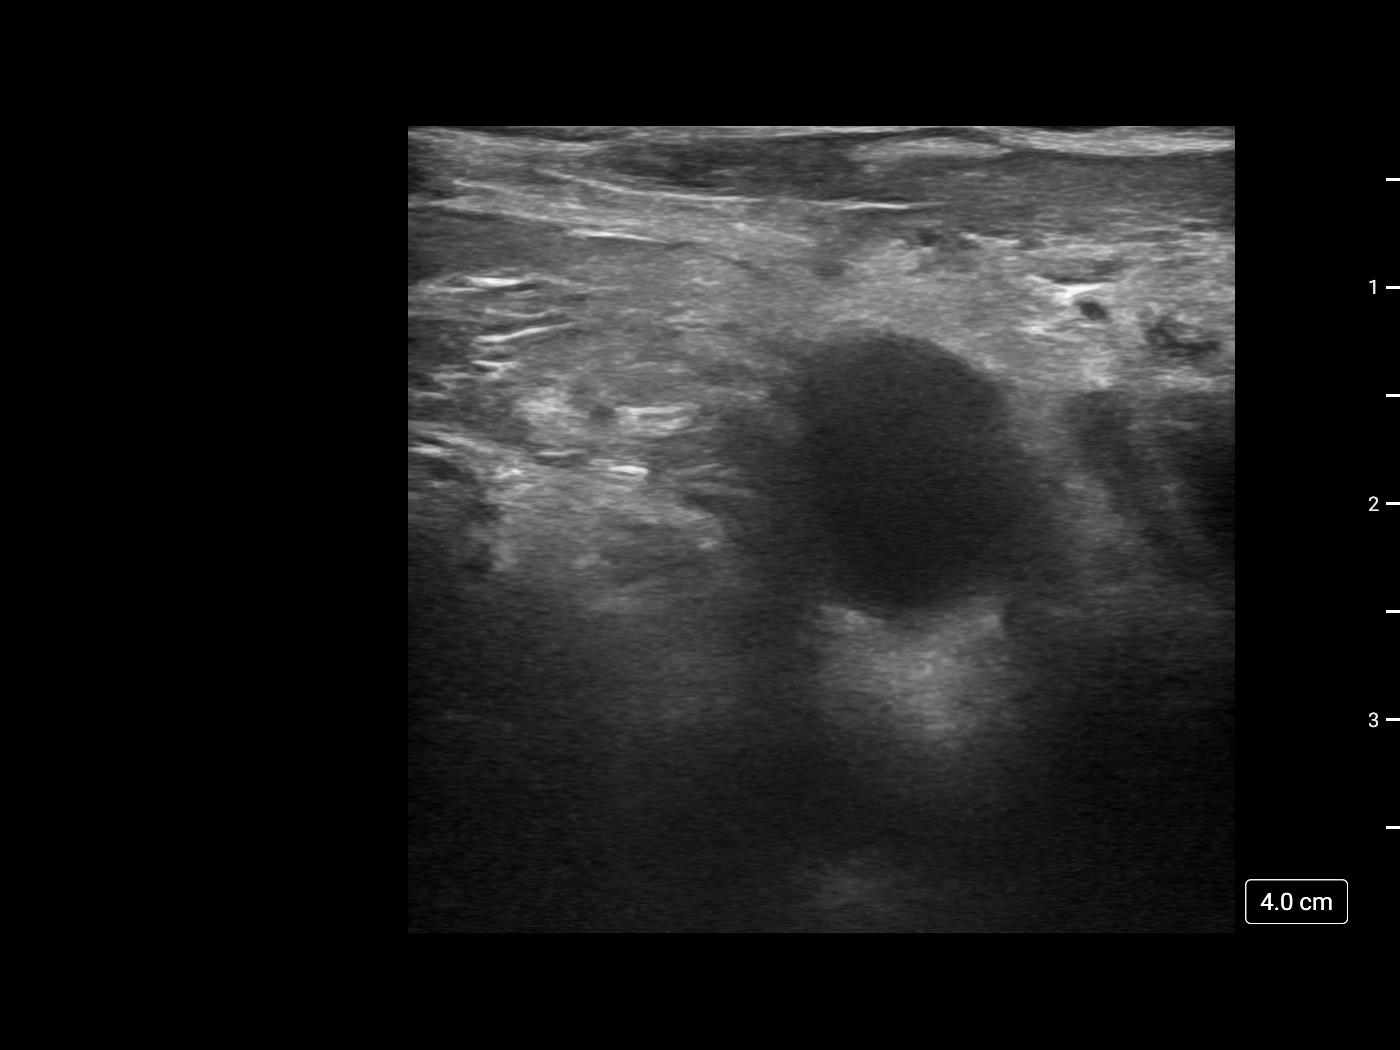

[1 of 1 positions shown; findings below may reference images not displayed]

EXAM:
Ultrasound and fluoroscopic guided right transjugular temporary
dialysis catheter insertion

MEDICATIONS:
None; The antibiotic was not administered within an appropriate time
interval prior to skin puncture.

ANESTHESIA/SEDATION:
Moderate (conscious) sedation was not employed during this
procedure.

FLUOROSCOPY:
Radiation Exposure Index (as provided by the fluoroscopic device): 6
mGy Kerma

COMPLICATIONS:
None immediate.

PROCEDURE:
Informed written consent was obtained from the patient after a
thorough discussion of the procedural risks, benefits and
alternatives. All questions were addressed. Maximal Sterile Barrier
Technique was utilized including caps, mask, sterile gowns, sterile
gloves, sterile drape, hand hygiene and skin antiseptic. A timeout
was performed prior to the initiation of the procedure.

Under ultrasound guidance and by using a micropuncture set, right
internal jugular vein was accessed. The ultrasound images of the
patent internal jugular vein were recorded and sent to the PACS. 018
inch wire was introduced through the needle, the wire was exchanged
for a 4 French coaxial dilator. Then, inner dilator and microwire
were exchanged for a 035 inch wire. Access site was dilated and a
12.5 French by 16 cm Mahurkar triple-lumen dialysis catheter was
inserted. The tip of the catheter seen at the lower SVC region. Both
ports of the catheter aspirated and flushed briskly. The ports were
flushed with saline and heparin solution and was anchored to the
skin using 2-0 silk. Biopatch and sterile dressing was placed.
Fluoroscopic spot images were obtained.
IMPRESSION: Ultrasound and fluoroscopic guided right transjugular temporary
dialysis catheter insertion.

## 2023-04-24 IMAGING — DX DG CHEST 1V PORT
1 series · 1 of 1 positions shown · non-contrast
Comparison: Chest x-ray dated July 06, 2021.

CLINICAL DATA: Shortness of breath.

EXAM:
PORTABLE CHEST 1 VIEW

[chest]
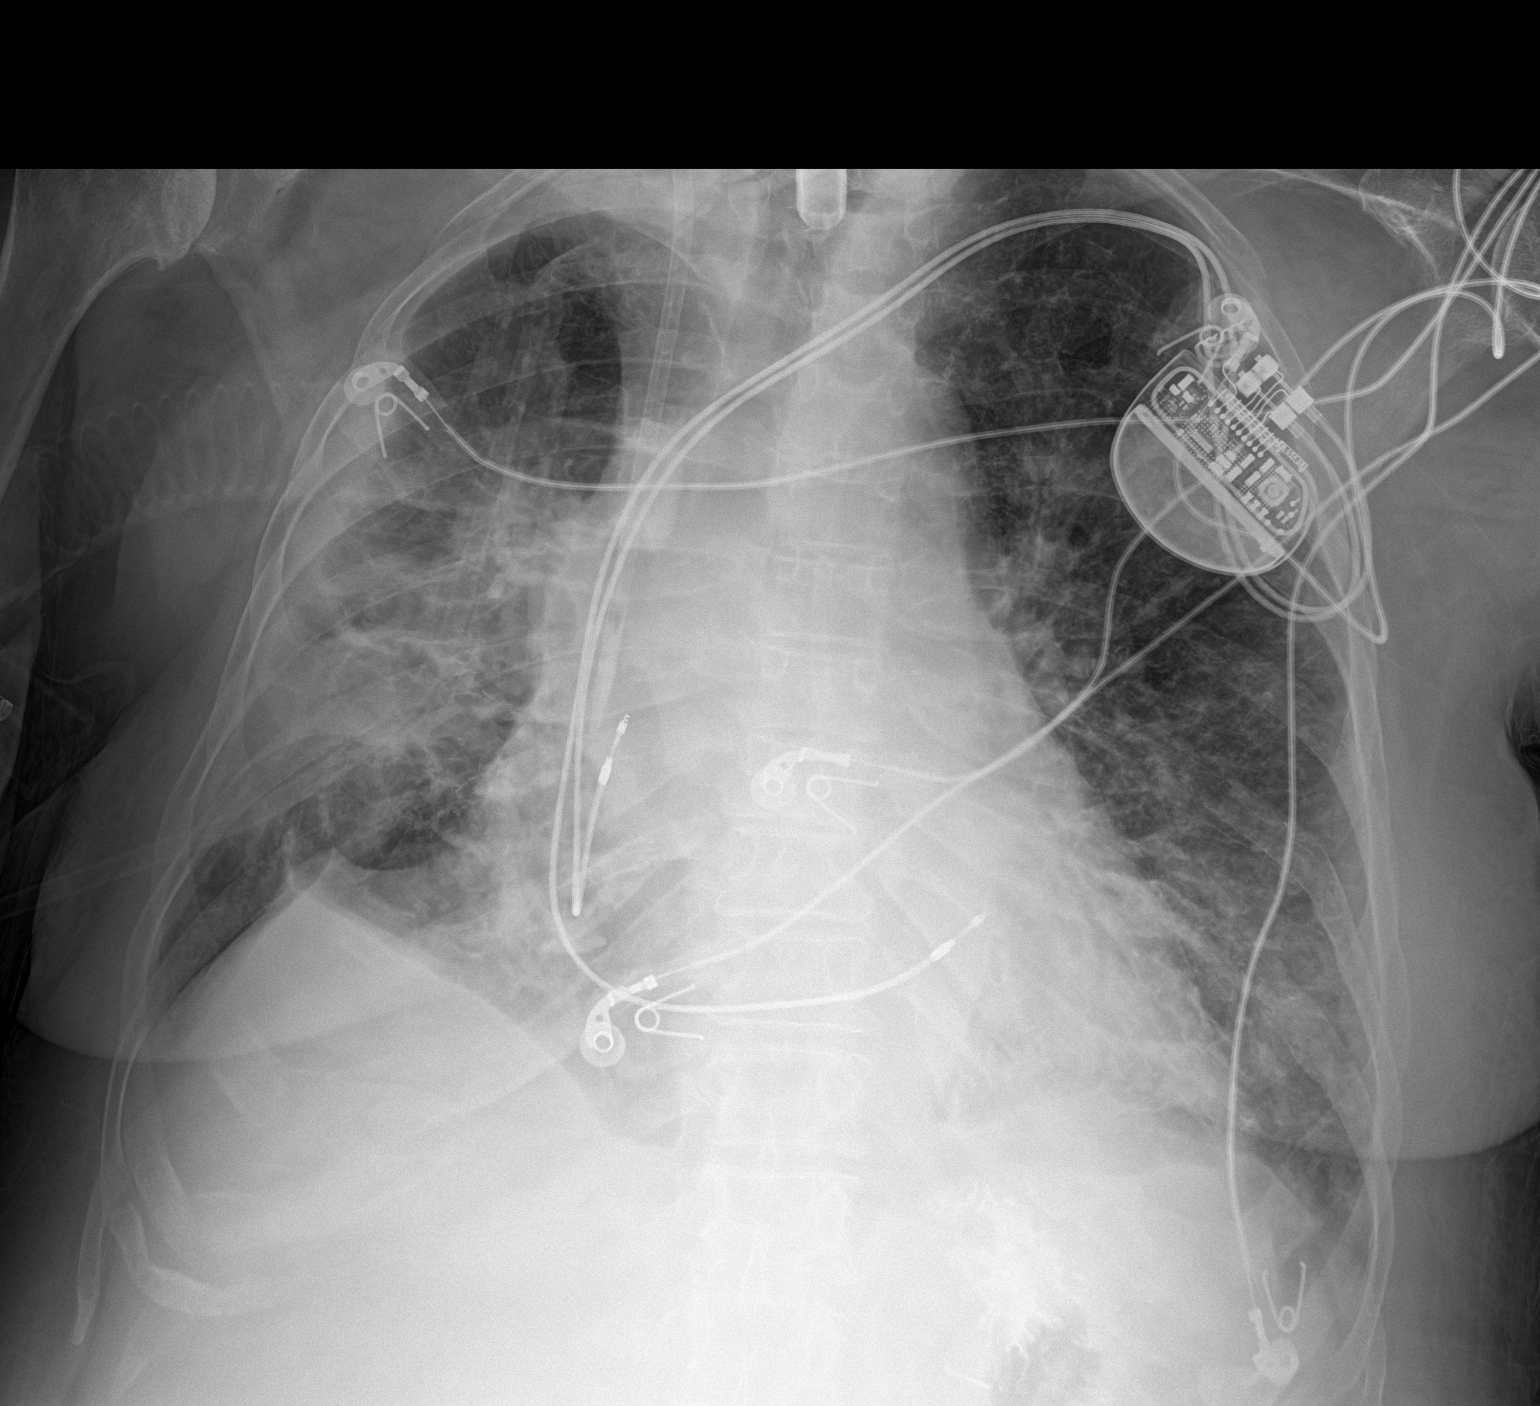

[1 of 1 positions shown; findings below may reference images not displayed]

FINDINGS: New right internal jugular dialysis catheter with tip at the
cavoatrial junction. Unchanged tracheostomy tube and left chest wall
pacemaker. Stable cardiomegaly. New mild diffuse interstitial
thickening. Unchanged consolidative opacity and architectural
distortion in the right mid lung. No pneumothorax or large pleural
effusion. No acute osseous abnormality.
IMPRESSION: 1. New mild interstitial pulmonary edema.
2. Unchanged right mid lung pneumonia and architectural distortion.
